# Patient Record
Sex: Female | Born: 1935 | Race: Black or African American | Hispanic: No | Smoking: Former smoker
Health system: Southern US, Community
[De-identification: ages and names within clinical notes are randomized; demographics above are authoritative.]

## PROBLEM LIST (undated history)

## (undated) DIAGNOSIS — E119 Type 2 diabetes mellitus without complications: Secondary | ICD-10-CM

## (undated) DIAGNOSIS — Z86718 Personal history of other venous thrombosis and embolism: Secondary | ICD-10-CM

## (undated) DIAGNOSIS — I10 Essential (primary) hypertension: Secondary | ICD-10-CM

## (undated) DIAGNOSIS — I639 Cerebral infarction, unspecified: Secondary | ICD-10-CM

## (undated) DIAGNOSIS — C50919 Malignant neoplasm of unspecified site of unspecified female breast: Secondary | ICD-10-CM

## (undated) DIAGNOSIS — E7800 Pure hypercholesterolemia, unspecified: Secondary | ICD-10-CM

## (undated) DIAGNOSIS — K8020 Calculus of gallbladder without cholecystitis without obstruction: Secondary | ICD-10-CM

## (undated) HISTORY — PX: CHOLECYSTECTOMY: SHX55

## (undated) HISTORY — DX: Calculus of gallbladder without cholecystitis without obstruction: K80.20

## (undated) HISTORY — DX: Personal history of other venous thrombosis and embolism: Z86.718

## (undated) HISTORY — DX: Pure hypercholesterolemia, unspecified: E78.00

## (undated) HISTORY — DX: Cerebral infarction, unspecified: I63.9

---

## 2019-05-09 ENCOUNTER — Other Ambulatory Visit: Payer: Self-pay | Admitting: Cardiology

## 2019-05-09 DIAGNOSIS — Z20822 Contact with and (suspected) exposure to covid-19: Secondary | ICD-10-CM

## 2019-05-11 ENCOUNTER — Telehealth: Payer: Self-pay

## 2019-05-11 LAB — NOVEL CORONAVIRUS, NAA: SARS-CoV-2, NAA: NOT DETECTED

## 2019-05-11 NOTE — Telephone Encounter (Signed)
Patient called in and received her negative covid result

## 2019-07-28 ENCOUNTER — Emergency Department (HOSPITAL_COMMUNITY): Payer: Medicare Other

## 2019-07-28 ENCOUNTER — Emergency Department (HOSPITAL_COMMUNITY)
Admission: EM | Admit: 2019-07-28 | Discharge: 2019-07-28 | Disposition: A | Discharge status: Home / Self Care | Payer: Medicare Other | Attending: Emergency Medicine | Admitting: Emergency Medicine

## 2019-07-28 ENCOUNTER — Encounter (HOSPITAL_COMMUNITY): Payer: Self-pay

## 2019-07-28 ENCOUNTER — Other Ambulatory Visit: Payer: Self-pay

## 2019-07-28 DIAGNOSIS — C50919 Malignant neoplasm of unspecified site of unspecified female breast: Secondary | ICD-10-CM | POA: Diagnosis not present

## 2019-07-28 DIAGNOSIS — I10 Essential (primary) hypertension: Secondary | ICD-10-CM | POA: Diagnosis not present

## 2019-07-28 DIAGNOSIS — W01198A Fall on same level from slipping, tripping and stumbling with subsequent striking against other object, initial encounter: Secondary | ICD-10-CM | POA: Insufficient documentation

## 2019-07-28 DIAGNOSIS — S0012XA Contusion of left eyelid and periocular area, initial encounter: Secondary | ICD-10-CM | POA: Diagnosis not present

## 2019-07-28 DIAGNOSIS — Y929 Unspecified place or not applicable: Secondary | ICD-10-CM | POA: Diagnosis not present

## 2019-07-28 DIAGNOSIS — Z79899 Other long term (current) drug therapy: Secondary | ICD-10-CM | POA: Diagnosis not present

## 2019-07-28 DIAGNOSIS — S0512XA Contusion of eyeball and orbital tissues, left eye, initial encounter: Secondary | ICD-10-CM

## 2019-07-28 DIAGNOSIS — Z7984 Long term (current) use of oral hypoglycemic drugs: Secondary | ICD-10-CM | POA: Insufficient documentation

## 2019-07-28 DIAGNOSIS — Y9389 Activity, other specified: Secondary | ICD-10-CM | POA: Insufficient documentation

## 2019-07-28 DIAGNOSIS — Z87891 Personal history of nicotine dependence: Secondary | ICD-10-CM | POA: Diagnosis not present

## 2019-07-28 DIAGNOSIS — E119 Type 2 diabetes mellitus without complications: Secondary | ICD-10-CM | POA: Insufficient documentation

## 2019-07-28 DIAGNOSIS — S0003XA Contusion of scalp, initial encounter: Secondary | ICD-10-CM | POA: Insufficient documentation

## 2019-07-28 DIAGNOSIS — Y999 Unspecified external cause status: Secondary | ICD-10-CM | POA: Diagnosis not present

## 2019-07-28 DIAGNOSIS — W19XXXA Unspecified fall, initial encounter: Secondary | ICD-10-CM

## 2019-07-28 DIAGNOSIS — S0990XA Unspecified injury of head, initial encounter: Secondary | ICD-10-CM | POA: Diagnosis present

## 2019-07-28 HISTORY — DX: Essential (primary) hypertension: I10

## 2019-07-28 HISTORY — DX: Malignant neoplasm of unspecified site of unspecified female breast: C50.919

## 2019-07-28 HISTORY — DX: Type 2 diabetes mellitus without complications: E11.9

## 2019-07-28 NOTE — ED Provider Notes (Signed)
[facility] DEPT Provider Note   CSN: [id number] Arrival date & time: 07/28/19  1151     History No chief complaint on file.   Terrylynn Shugarts is a 86 y.o. female with a reported past medical history of metastatic breast cancer primarily treated at [facility], anticoagulated with Xarelto who presents today for evaluation after a fall. She reports that she fell on Tuesday while bringing groceries inside.  She states that this was a mechanical slip causing her to fall striking the left side of her face on a railing.  She denies any loss of consciousness.  On Wednesday she reportedly went to her cancer center appointment where she was told to get this checked out according to patient however she did not. She is reportedly visiting family today who saw this and called 911. She reports in addition that, while she had a "goose egg" on the left side of her forehead that today when she woke up she had bruising under her left eye.  She denies any changes in vision to her left eye.  She reports that she feels at her normal baseline.  No vomiting or new pains.  She denies any concern for injuries from this fall in her chest, abdomen, pelvis, and bilateral upper and lower extremities.  No new back pains or concern for a back injury per patient.  History is limited as at this time unable to obtain results from [facility] and patient is unsure of all of her medications and exact diagnoses.  HPI     Past Medical History:  Diagnosis Date  . Breast cancer ([place])   . Diabetes mellitus without complication ([place])   . Hypertension     There are no problems to display for this patient.   Past Surgical History:  Procedure Laterality Date  . CHOLECYSTECTOMY       OB History   No obstetric history on file.     No family history on file.  Social History   Tobacco Use  . Smoking status: Former Research scientist (life sciences)  . Smokeless tobacco: Never Used  Substance Use Topics  . Alcohol use: Never    . Drug use: Never    Home Medications Prior to Admission medications   Medication Sig Start Date End Date Taking? Authorizing Provider  gabapentin (NEURONTIN) 100 MG capsule Take 100 mg by mouth at bedtime. 05/15/19  Yes [provider]  glimepiride (AMARYL) 1 MG tablet Take 1 mg by mouth every morning. 04/08/19  Yes [provider]  IBRANCE 125 MG tablet Take 125 mg by mouth daily.  07/11/19  Yes [provider]  isosorbide mononitrate (IMDUR) 60 MG 24 hr tablet Take 60 mg by mouth daily. 07/10/19  Yes [provider]  letrozole (FEMARA) 2.5 MG tablet Take 2.5 mg by mouth daily. 07/15/19  Yes [provider]  lisinopril (ZESTRIL) 20 MG tablet Take 20 mg by mouth daily. 07/10/19  Yes [provider]  metFORMIN (GLUCOPHAGE) 1000 MG tablet Take 1,000 mg by mouth 2 (two) times daily. 06/15/19  Yes [provider]  Multiple Vitamin (MULTIVITAMIN) tablet Take 1 tablet by mouth daily.   Yes [provider]  Omega-3 Fatty Acids (FISH OIL) 1000 MG CAPS Take 1,000 mg by mouth daily.   Yes [provider]  traZODone (DESYREL) 100 MG tablet Take 100 mg by mouth at bedtime. 06/30/19  Yes [provider]  XARELTO 15 MG TABS tablet Take 15 mg by mouth every morning. 05/15/19  Yes  [provider]  ACCU-CHEK GUIDE test strip 1 each by Other route daily. 07/10/19   [provider]    Allergies    Patient has no known allergies.  Review of Systems   Review of Systems  Constitutional: Negative for chills and fever.  HENT: Positive for facial swelling. Negative for sore throat, trouble swallowing and voice change.        Contusion on left forehead, edema and ecchymosis under left eye.  Eyes: Negative for visual disturbance.  Respiratory: Negative for cough and shortness of breath.   Cardiovascular: Negative for chest pain and leg swelling.  Gastrointestinal: Negative for nausea and vomiting.  Musculoskeletal:  Negative for back pain and neck pain.  Neurological: Negative for weakness, numbness and headaches.  Psychiatric/Behavioral: Negative for confusion.  All other systems reviewed and are negative.   Physical Exam Updated Vital Signs BP (!) 166/67   Pulse 65   Temp 97.7 F (36.5 C) (Oral)   Resp 16   SpO2 98%   Physical Exam Vitals and nursing note reviewed.  Constitutional:      General: She is not in acute distress.    Appearance: She is well-developed. She is not ill-appearing.  HENT:     Head: Normocephalic.     Comments: There is a 2 cm contusion on the left anterior forehead.  There is a moderate amount of edema inferior to the left eye along the lower lid with associated ecchymosis. No battle signs bilaterally.  No ecchymosis under the right eye.  Tenderness to palpation over the contusion on the forehead.  No palpable crepitus or deformities. Eyes:     Extraocular Movements: Extraocular movements intact.     Conjunctiva/sclera: Conjunctivae normal.     Pupils: Pupils are equal, round, and reactive to light.     Comments: Full, pain-free EOM bilaterally.  Neck:     Comments: No midline C-spine tenderness to palpation, step-offs, or deformities. Cardiovascular:     Rate and Rhythm: Normal rate and regular rhythm.     Pulses: Normal pulses.     Heart sounds: Normal heart sounds. No murmur.     Comments: 2+ DP, radial pulses bilaterally. Pulmonary:     Effort: Pulmonary effort is normal. No respiratory distress.     Breath sounds: Normal breath sounds.  Abdominal:     General: Abdomen is flat.     Palpations: Abdomen is soft.     Tenderness: There is no abdominal tenderness.  Musculoskeletal:     Cervical back: Normal range of motion and neck supple.     Comments: 5/5 strength bilateral arms and legs.  Skin:    General: Skin is warm and dry.  Neurological:     General: No focal deficit present.     Mental Status: She is alert.     Motor: No weakness.     Comments:  Patient is able to walk to the bathroom without difficulties or significant abnormal gait. She is awake and alert, answers all questions appropriately.  Psychiatric:        Mood and Affect: Mood normal.        Behavior: Behavior normal.     ED Results / Procedures / Treatments   Labs (all labs ordered are listed, but only abnormal results are displayed) Labs Reviewed - No data to display  EKG None  Radiology CT Head Wo Contrast  Result Date: 07/28/2019 CLINICAL DATA:  Fall 3 days ago.  Head injury. EXAM: CT HEAD WITHOUT CONTRAST  CT MAXILLOFACIAL WITHOUT CONTRAST CT CERVICAL SPINE WITHOUT CONTRAST TECHNIQUE: Multidetector CT imaging of the head, cervical spine, and maxillofacial structures were performed using the standard protocol without intravenous contrast. Multiplanar CT image reconstructions of the cervical spine and maxillofacial structures were also generated. COMPARISON:  None. FINDINGS: CT HEAD FINDINGS Brain: Generalized atrophy without hydrocephalus. Patchy white matter hypodensity bilaterally compatible with chronic microvascular ischemia. Chronic infarct right cerebellum. Negative for acute infarct, intracranial hemorrhage, or mass. No midline shift. Vascular: Negative for hyperdense vessel Skull: Negative Other: None CT MAXILLOFACIAL FINDINGS Osseous: Negative for fracture.  No skeletal lesion identified. Orbits: Bilateral cataract extraction.  No orbital mass or edema. Sinuses: Moderate mucosal edema right maxillary sinus. Mild mucosal edema in the left maxillary sinus and ethmoid sinuses. No air-fluid levels identified. Soft tissues: Soft tissue swelling over the left maxilla and lateral orbit. CT CERVICAL SPINE FINDINGS Alignment: 3 mm anterolisthesis C7-T1. 2 mm anterolisthesis T1-2 and T2-3. Cervical alignment normal Skull base and vertebrae: Negative for cervical spine fracture. Soft tissues and spinal canal: Atherosclerotic calcification. No soft tissue mass Disc levels:  Multilevel disc degeneration and spurring throughout the cervical spine. Anterolisthesis in the upper thoracic spine appears degenerative with facet degeneration of these levels. Upper chest: Clear Other: None IMPRESSION: 1. Atrophy and chronic microvascular ischemic change in the white matter. No acute intracranial abnormality 2. Negative for facial fracture 3. Negative for cervical spine fracture. Electronically Signed   By: Franchot Gallo M.D.   On: 07/28/2019 13:12   CT Cervical Spine Wo Contrast  Result Date: 07/28/2019 CLINICAL DATA:  Fall 3 days ago.  Head injury. EXAM: CT HEAD WITHOUT CONTRAST CT MAXILLOFACIAL WITHOUT CONTRAST CT CERVICAL SPINE WITHOUT CONTRAST TECHNIQUE: Multidetector CT imaging of the head, cervical spine, and maxillofacial structures were performed using the standard protocol without intravenous contrast. Multiplanar CT image reconstructions of the cervical spine and maxillofacial structures were also generated. COMPARISON:  None. FINDINGS: CT HEAD FINDINGS Brain: Generalized atrophy without hydrocephalus. Patchy white matter hypodensity bilaterally compatible with chronic microvascular ischemia. Chronic infarct right cerebellum. Negative for acute infarct, intracranial hemorrhage, or mass. No midline shift. Vascular: Negative for hyperdense vessel Skull: Negative Other: None CT MAXILLOFACIAL FINDINGS Osseous: Negative for fracture.  No skeletal lesion identified. Orbits: Bilateral cataract extraction.  No orbital mass or edema. Sinuses: Moderate mucosal edema right maxillary sinus. Mild mucosal edema in the left maxillary sinus and ethmoid sinuses. No air-fluid levels identified. Soft tissues: Soft tissue swelling over the left maxilla and lateral orbit. CT CERVICAL SPINE FINDINGS Alignment: 3 mm anterolisthesis C7-T1. 2 mm anterolisthesis T1-2 and T2-3. Cervical alignment normal Skull base and vertebrae: Negative for cervical spine fracture. Soft tissues and spinal canal:  Atherosclerotic calcification. No soft tissue mass Disc levels: Multilevel disc degeneration and spurring throughout the cervical spine. Anterolisthesis in the upper thoracic spine appears degenerative with facet degeneration of these levels. Upper chest: Clear Other: None IMPRESSION: 1. Atrophy and chronic microvascular ischemic change in the white matter. No acute intracranial abnormality 2. Negative for facial fracture 3. Negative for cervical spine fracture. Electronically Signed   By: Franchot Gallo M.D.   On: 07/28/2019 13:12   CT Maxillofacial WO CM  Result Date: 07/28/2019 CLINICAL DATA:  Fall 3 days ago.  Head injury. EXAM: CT HEAD WITHOUT CONTRAST CT MAXILLOFACIAL WITHOUT CONTRAST CT CERVICAL SPINE WITHOUT CONTRAST TECHNIQUE: Multidetector CT imaging of the head, cervical spine, and maxillofacial structures were performed using the standard protocol without intravenous contrast. Multiplanar CT image reconstructions of the cervical  spine and maxillofacial structures were also generated. COMPARISON:  None. FINDINGS: CT HEAD FINDINGS Brain: Generalized atrophy without hydrocephalus. Patchy white matter hypodensity bilaterally compatible with chronic microvascular ischemia. Chronic infarct right cerebellum. Negative for acute infarct, intracranial hemorrhage, or mass. No midline shift. Vascular: Negative for hyperdense vessel Skull: Negative Other: None CT MAXILLOFACIAL FINDINGS Osseous: Negative for fracture.  No skeletal lesion identified. Orbits: Bilateral cataract extraction.  No orbital mass or edema. Sinuses: Moderate mucosal edema right maxillary sinus. Mild mucosal edema in the left maxillary sinus and ethmoid sinuses. No air-fluid levels identified. Soft tissues: Soft tissue swelling over the left maxilla and lateral orbit. CT CERVICAL SPINE FINDINGS Alignment: 3 mm anterolisthesis C7-T1. 2 mm anterolisthesis T1-2 and T2-3. Cervical alignment normal Skull base and vertebrae: Negative for cervical  spine fracture. Soft tissues and spinal canal: Atherosclerotic calcification. No soft tissue mass Disc levels: Multilevel disc degeneration and spurring throughout the cervical spine. Anterolisthesis in the upper thoracic spine appears degenerative with facet degeneration of these levels. Upper chest: Clear Other: None IMPRESSION: 1. Atrophy and chronic microvascular ischemic change in the white matter. No acute intracranial abnormality 2. Negative for facial fracture 3. Negative for cervical spine fracture. Electronically Signed   By: Franchot Gallo M.D.   On: 07/28/2019 13:12    Procedures Procedures (including critical care time)  Medications Ordered in ED Medications - No data to display  ED Course  I have reviewed the triage vital signs and the nursing notes.  Pertinent labs & imaging results that were available during my care of the patient were reviewed by me and considered in my medical decision making (see chart for details).    MDM Rules/Calculators/A&P                     Patient presents today for evaluation after a fall that occurred 3 days ago.  She is reportedly anticoagulated with Xarelto. On exam she has a contusion on the left-sided forehead and ecchymosis under the left eye. She is awake and alert answers questions appropriately and oriented. As she is anticoagulated, along with her age CT scan head, face, and neck were obtained.  CT scans showed no evidence of acute injury.    This patient was seen as a shared visit with Dr. Wilson Singer.   Patient given information on fall prevention at home, along with educated that if she hits her head while on blood thinners she needs to present to the emergency room immediately.   Return precautions were discussed with patient who states their understanding.  At the time of discharge patient denied any unaddressed complaints or concerns.  Patient is agreeable for discharge home.  Note: Portions of this report may have been transcribed  using voice recognition software. Every effort was made to ensure accuracy; however, inadvertent computerized transcription errors may be present   Final Clinical Impression(s) / ED Diagnoses Final diagnoses:  Fall, initial encounter  Contusion of scalp, initial encounter  Ecchymosis of left eye, initial encounter    Rx / DC Orders ED Discharge Orders    None       Ollen Gross 07/28/19 2155    Virgel Manifold, MD 08/01/19 [phone]

## 2019-07-28 NOTE — Discharge Instructions (Signed)
If you hit your head while taking blood thinners you need to go to the emergency room immediately, rather than waiting multiple days.  Failure to do this can result in a life or function threatening brain bleed.

## 2019-07-28 NOTE — ED Triage Notes (Signed)
86 yo female brought in by [facility] from home s/p trip and fall on Tuesday. Pt was outdoors bringing groceries in and tripped while going up the stairs.  Hit head on banister. Pt is on Xarelto. Denies LOC. Denies neck or back pain. Able to get herself up. Hematoma on left forehead. Pt was not seen at that time. On Wednesday pt went to [facility] and MD advised her to be seen in ED. Pt is visiting family today who called EMS.

## 2019-07-28 NOTE — ED Notes (Signed)
Elizabeth, PA at bedside.  

## 2019-07-28 NOTE — ED Notes (Signed)
purewick placed on patient.  

## 2019-07-28 NOTE — ED Notes (Signed)
Pt's family member is Heinz Knuckles [phone].

## 2019-07-28 NOTE — ED Triage Notes (Signed)
Pt ambulated to BR from EMS stretcher independently. Gait slow but steady. Returned to room stretcher without distress.

## 2019-07-28 NOTE — ED Notes (Signed)
Patient reports she wet her pants and underwear and would like for family to bring her a change of clothes when discharged.

## 2020-07-09 ENCOUNTER — Emergency Department (HOSPITAL_COMMUNITY)
Admission: EM | Admit: 2020-07-09 | Discharge: 2020-07-09 | Disposition: A | Discharge status: Home / Self Care | Payer: Medicare Other | Attending: Emergency Medicine | Admitting: Emergency Medicine

## 2020-07-09 ENCOUNTER — Emergency Department (HOSPITAL_COMMUNITY): Payer: Medicare Other

## 2020-07-09 DIAGNOSIS — Z853 Personal history of malignant neoplasm of breast: Secondary | ICD-10-CM | POA: Diagnosis not present

## 2020-07-09 DIAGNOSIS — R0602 Shortness of breath: Secondary | ICD-10-CM

## 2020-07-09 DIAGNOSIS — Z87891 Personal history of nicotine dependence: Secondary | ICD-10-CM | POA: Insufficient documentation

## 2020-07-09 DIAGNOSIS — I10 Essential (primary) hypertension: Secondary | ICD-10-CM | POA: Insufficient documentation

## 2020-07-09 DIAGNOSIS — Z79899 Other long term (current) drug therapy: Secondary | ICD-10-CM | POA: Insufficient documentation

## 2020-07-09 DIAGNOSIS — Z7901 Long term (current) use of anticoagulants: Secondary | ICD-10-CM | POA: Insufficient documentation

## 2020-07-09 DIAGNOSIS — Z20822 Contact with and (suspected) exposure to covid-19: Secondary | ICD-10-CM | POA: Insufficient documentation

## 2020-07-09 DIAGNOSIS — E119 Type 2 diabetes mellitus without complications: Secondary | ICD-10-CM | POA: Insufficient documentation

## 2020-07-09 DIAGNOSIS — Z7984 Long term (current) use of oral hypoglycemic drugs: Secondary | ICD-10-CM | POA: Insufficient documentation

## 2020-07-09 LAB — COMPREHENSIVE METABOLIC PANEL
ALT: 15 U/L (ref 0–44)
AST: 24 U/L (ref 15–41)
Albumin: 4.2 g/dL (ref 3.5–5.0)
Alkaline Phosphatase: 39 U/L (ref 38–126)
Anion gap: 11 (ref 5–15)
BUN: 24 mg/dL — ABNORMAL HIGH (ref 8–23)
CO2: 23 mmol/L (ref 22–32)
Calcium: 10.1 mg/dL (ref 8.9–10.3)
Chloride: 104 mmol/L (ref 98–111)
Creatinine, Ser: 1.44 mg/dL — ABNORMAL HIGH (ref 0.44–1.00)
GFR, Estimated: 35 mL/min — ABNORMAL LOW (ref >60)
Glucose, Bld: 186 mg/dL — ABNORMAL HIGH (ref 70–99)
Potassium: 4.4 mmol/L (ref 3.5–5.1)
Sodium: 138 mmol/L (ref 135–145)
Total Bilirubin: 0.5 mg/dL (ref 0.3–1.2)
Total Protein: 7.5 g/dL (ref 6.5–8.1)

## 2020-07-09 LAB — CBC WITH DIFFERENTIAL/PLATELET
Abs Immature Granulocytes: 0.01 10*3/uL (ref 0.00–0.07)
Basophils Absolute: 0 10*3/uL (ref 0.0–0.1)
Basophils Relative: 1 %
Eosinophils Absolute: 0 10*3/uL (ref 0.0–0.5)
Eosinophils Relative: 1 %
HCT: 29.2 % — ABNORMAL LOW (ref 36.0–46.0)
Hemoglobin: 10.1 g/dL — ABNORMAL LOW (ref 12.0–15.0)
Immature Granulocytes: 0 %
Lymphocytes Relative: 34 %
Lymphs Abs: 1.2 10*3/uL (ref 0.7–4.0)
MCH: 37.1 pg — ABNORMAL HIGH (ref 26.0–34.0)
MCHC: 34.6 g/dL (ref 30.0–36.0)
MCV: 107.4 fL — ABNORMAL HIGH (ref 80.0–100.0)
Monocytes Absolute: 0.4 10*3/uL (ref 0.1–1.0)
Monocytes Relative: 11 %
Neutro Abs: 1.9 10*3/uL (ref 1.7–7.7)
Neutrophils Relative %: 53 %
Platelets: 201 10*3/uL (ref 150–400)
RBC: 2.72 MIL/uL — ABNORMAL LOW (ref 3.87–5.11)
RDW: 13.5 % (ref 11.5–15.5)
WBC: 3.6 10*3/uL — ABNORMAL LOW (ref 4.0–10.5)
nRBC: 0 % (ref 0.0–0.2)

## 2020-07-09 LAB — SARS CORONAVIRUS 2 BY RT PCR (HOSPITAL ORDER, PERFORMED IN ~~LOC~~ HOSPITAL LAB): SARS Coronavirus 2: NEGATIVE

## 2020-07-09 LAB — TROPONIN I (HIGH SENSITIVITY): Troponin I (High Sensitivity): 15 ng/L (ref <18)

## 2020-07-09 LAB — BRAIN NATRIURETIC PEPTIDE: B Natriuretic Peptide: 108.6 pg/mL — ABNORMAL HIGH (ref 0.0–100.0)

## 2020-07-09 MED ORDER — ALBUTEROL SULFATE HFA 108 (90 BASE) MCG/ACT IN AERS: 2.0000 | INHALATION_SPRAY | RESPIRATORY_TRACT | 0 refills | Status: DC | PRN

## 2020-07-09 NOTE — Discharge Instructions (Addendum)
Your work-up today was negative.  Covid test was negative. I have sent inhaler to your pharmacy.  You can use when needed if feeling overly short of breath. Follow-up with your primary care doctor. Return here for new concerns.

## 2020-07-09 NOTE — ED Provider Notes (Signed)
[facility] DEPT Provider Note   CSN: [id number] Arrival date & time: 07/09/20  2119     History Chief Complaint  Patient presents with  . Shortness of Breath    Kristina Jackson is a 87 y.o. female.  The history is provided by medical records and the patient.  Shortness of Breath   87 y.o. F with hx of breast cancer, DM, HTN, presenting to the ED for SOB. Patient states this has been an ongoing issue for the past 4 to 5 years. States over the past 2 months it seems like it is getting worse. She states her symptoms actually improve when she is up and moving around, worse when she is just lying in bed or trying to rest. She has not had any cough or fever. Reports recent Covid test yesterday that was negative. She denies any sick contacts.  Past Medical History:  Diagnosis Date  . Breast cancer ([place])   . Diabetes mellitus without complication ([place])   . Hypertension     There are no problems to display for this patient.   Past Surgical History:  Procedure Laterality Date  . CHOLECYSTECTOMY       OB History   No obstetric history on file.     No family history on file.  Social History   Tobacco Use  . Smoking status: Former Research scientist (life sciences)  . Smokeless tobacco: Never Used  Vaping Use  . Vaping Use: Never used  Substance Use Topics  . Alcohol use: Never  . Drug use: Never    Home Medications Prior to Admission medications   Medication Sig Start Date End Date Taking? Authorizing Provider  ACCU-CHEK GUIDE test strip 1 each by Other route daily. 07/10/19   [provider]  gabapentin (NEURONTIN) 100 MG capsule Take 100 mg by mouth at bedtime. 05/15/19   [provider]  glimepiride (AMARYL) 1 MG tablet Take 1 mg by mouth every morning. 04/08/19   [provider]  IBRANCE 125 MG tablet Take 125 mg by mouth daily.  07/11/19   [provider]  isosorbide mononitrate (IMDUR) 60 MG 24 hr tablet Take 60 mg by mouth  daily. 07/10/19   [provider]  letrozole (FEMARA) 2.5 MG tablet Take 2.5 mg by mouth daily. 07/15/19   [provider]  lisinopril (ZESTRIL) 20 MG tablet Take 20 mg by mouth daily. 07/10/19   [provider]  metFORMIN (GLUCOPHAGE) 1000 MG tablet Take 1,000 mg by mouth 2 (two) times daily. 06/15/19   [provider]  Multiple Vitamin (MULTIVITAMIN) tablet Take 1 tablet by mouth daily.    [provider]  Omega-3 Fatty Acids (FISH OIL) 1000 MG CAPS Take 1,000 mg by mouth daily.    [provider]  traZODone (DESYREL) 100 MG tablet Take 100 mg by mouth at bedtime. 06/30/19   [provider]  XARELTO 15 MG TABS tablet Take 15 mg by mouth every morning. 05/15/19   [provider]    Allergies    Patient has no known allergies.  Review of Systems   Review of Systems  Respiratory: Positive for shortness of breath.   All other systems reviewed and are negative.   Physical Exam Updated Vital Signs BP (!) 179/73   Pulse 69   Temp 98.3 F (36.8 C) (Oral)   Resp 17   Ht 5\' 2"  (1.575 m)   Wt 54.4 kg   SpO2 100%   BMI 21.95 kg/m  Physical Exam Vitals and nursing note reviewed.  Constitutional:      Appearance: She is well-developed and well-nourished.  HENT:     Head: Normocephalic and atraumatic.     Mouth/Throat:     Mouth: Oropharynx is clear and moist.  Eyes:     Extraocular Movements: EOM normal.     Conjunctiva/sclera: Conjunctivae normal.     Pupils: Pupils are equal, round, and reactive to light.  Cardiovascular:     Rate and Rhythm: Normal rate and regular rhythm.     Heart sounds: Normal heart sounds.  Pulmonary:     Effort: Pulmonary effort is normal.     Breath sounds: Normal breath sounds. No wheezing or rhonchi.     Comments: NAD, breathing easily, lungs CTAB Abdominal:     General: Bowel sounds are normal.     Palpations: Abdomen is soft.  Musculoskeletal:        General: Normal range of  motion.     Cervical back: Normal range of motion.  Skin:    General: Skin is warm and dry.  Neurological:     Mental Status: She is alert and oriented to person, place, and time.  Psychiatric:        Mood and Affect: Mood and affect normal.     Comments: Seems a bit anxious     ED Results / Procedures / Treatments   Labs (all labs ordered are listed, but only abnormal results are displayed) Labs Reviewed  CBC WITH DIFFERENTIAL/PLATELET - Abnormal; Notable for the following components:      Result Value   WBC 3.6 (*)    RBC 2.72 (*)    Hemoglobin 10.1 (*)    HCT 29.2 (*)    MCV 107.4 (*)    MCH 37.1 (*)    All other components within normal limits  COMPREHENSIVE METABOLIC PANEL - Abnormal; Notable for the following components:   Glucose, Bld 186 (*)    BUN 24 (*)    Creatinine, Ser 1.44 (*)    GFR, Estimated 35 (*)    All other components within normal limits  BRAIN NATRIURETIC PEPTIDE - Abnormal; Notable for the following components:   B Natriuretic Peptide 108.6 (*)    All other components within normal limits  SARS CORONAVIRUS 2 BY RT PCR (HOSPITAL ORDER, [place] [facility])  TROPONIN I (HIGH SENSITIVITY)  TROPONIN I (HIGH SENSITIVITY)    EKG EKG Interpretation  Date/Time:  Tuesday July 09 2020 21:46:22 EST Ventricular Rate:  71 PR Interval:    QRS Duration: 154 QT Interval:  441 QTC Calculation: 480 R Axis:   68 Text Interpretation: Sinus rhythm Right bundle branch block No previous ECGs available Confirmed by Wandra Arthurs ([id number]) on 07/09/2020 10:53:11 PM   Radiology DG Chest Port 1 View  Result Date: 07/09/2020 CLINICAL DATA:  Shortness of breath. EXAM: PORTABLE CHEST 1 VIEW COMPARISON:  None. FINDINGS: Lung volumes are low. Mild cardiomegaly. Normal mediastinal contours for degree of inspiration and technique. Mild peribronchial thickening. No focal airspace disease. No pleural fluid or pneumothorax. Degenerative change in the spine.  IMPRESSION: 1. Mild peribronchial thickening suggesting bronchitis. 2. Mild cardiomegaly. Electronically Signed   By: Keith Rake M.D.   On: 07/09/2020 22:22    Procedures Procedures   Medications Ordered in ED Medications - No data to display  ED Course  I have reviewed the triage vital signs and the nursing notes.  Pertinent labs & imaging results that were  available during my care of the patient were reviewed by me and considered in my medical decision making (see chart for details).    MDM Rules/Calculators/A&P  87 year old female presenting to the ED with shortness of breath.  Reports ongoing for for 5 years, worse over the past few months.  Denies recent illness, cough, or fever.  States she had a Covid test yesterday that was negative.  She is afebrile and nontoxic in appearance here.  She does appear mildly anxious but is in NAD distress.  Lungs clear bilaterally.  EKG with RBBB. Labs grossly reassuring, troponin negative. BNP is mildly elevated but patient does not appear fluid overloaded. Chest x-ray with findings of peribronchial thickening, however she is without cough, fever, or other infectious symptoms. Given her reported course over years 2 months, doubt acute infection. She does appear somewhat anxious, I suspect this may be playing a role in her symptoms. For she is stable for discharge home.  She was given prescription for inhaler to use as needed. Close follow-up with PCP. Return here for any new or acute changes.  Shared visit with attending physician, Dr. Darl Householder, who agrees with treatment plan.  Final Clinical Impression(s) / ED Diagnoses Final diagnoses:  Shortness of breath    Rx / DC Orders ED Discharge Orders         Ordered    albuterol (VENTOLIN HFA) 108 (90 Base) MCG/ACT inhaler  Every 4 hours PRN        07/10/20 0008    albuterol (VENTOLIN HFA) 108 (90 Base) MCG/ACT inhaler  Every 4 hours PRN,   Status:  Discontinued        07/09/20 2330            Larene Pickett, PA-C 07/10/20 0342    Drenda Freeze, MD 07/10/20 [phone]

## 2020-07-09 NOTE — ED Triage Notes (Signed)
Patient BIB EMS for SOB. Patient denies history of COPD, CHF, asthma. Had COVID two weeks ago, negative test since then. Patient does not use an inhaler at home. Patient endorses history of anxiety. Patient is 100% on room air. Patient asked to stay in bed and not pace in room due to being a fall risk. Patient is AxOx4.

## 2020-07-10 MED ORDER — ALBUTEROL SULFATE HFA 108 (90 BASE) MCG/ACT IN AERS: 2.0000 | INHALATION_SPRAY | RESPIRATORY_TRACT | 0 refills | Status: DC | PRN

## 2021-05-28 IMAGING — CT CT MAXILLOFACIAL W/O CM
3 of 4 series · 14 of 47 positions shown, 17 images · non-contrast
Comparison: None.

CLINICAL DATA: Fall 3 days ago.  Head injury.

EXAM:
CT HEAD WITHOUT CONTRAST
CT MAXILLOFACIAL WITHOUT CONTRAST
CT CERVICAL SPINE WITHOUT CONTRAST
TECHNIQUE: Multidetector CT imaging of the head, cervical spine, and
maxillofacial structures were performed using the standard protocol
without intravenous contrast. Multiplanar CT image reconstructions
of the cervical spine and maxillofacial structures were also
generated.

[Series 3: max soft · axial · 0.32mm/px · z∈[-270,-116]mm · 9 of 91 slices shown, 12 images]
[im 7/91  brain]
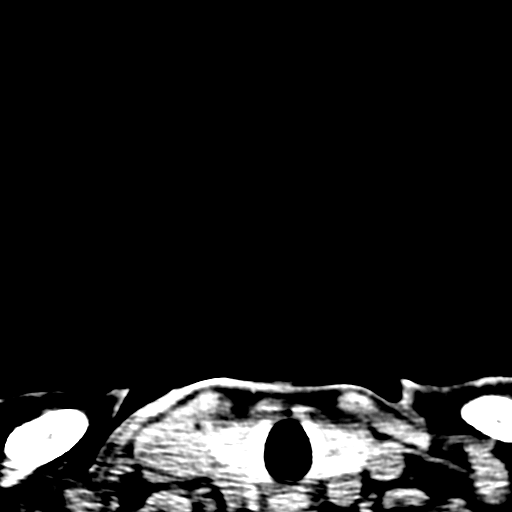
[im 7/91  bone]
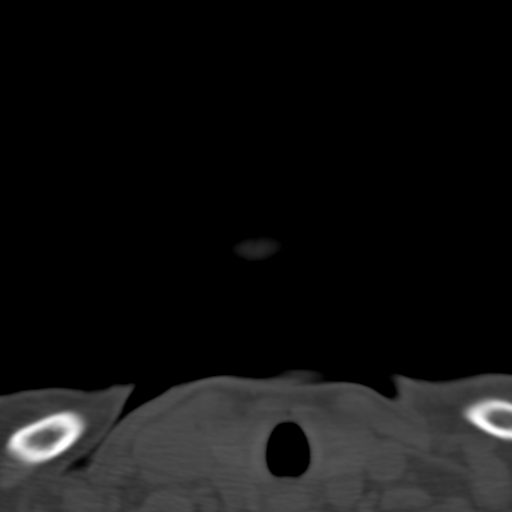
[im 16/91  bone]
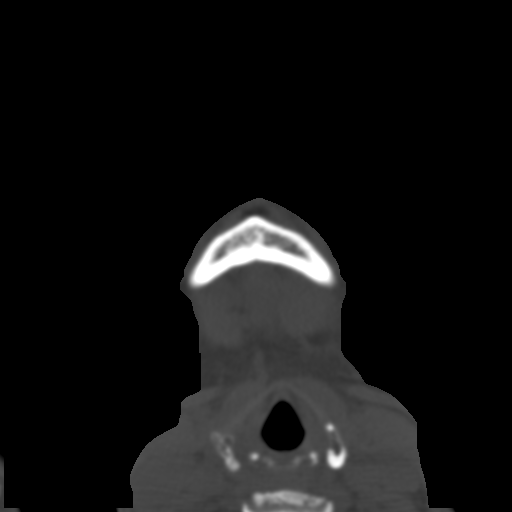
[im 25/91  bone]
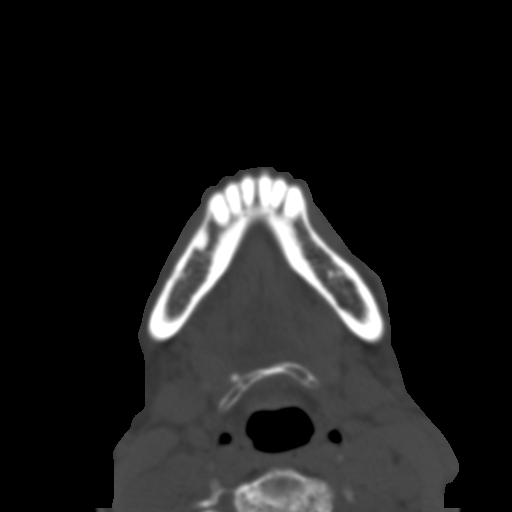
[im 35/91  bone]
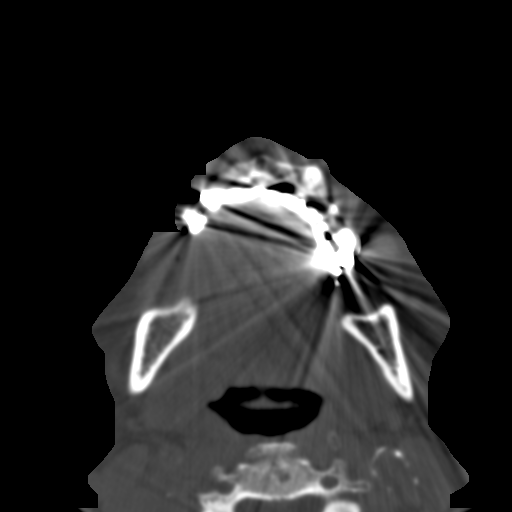
[im 47/91  brain]
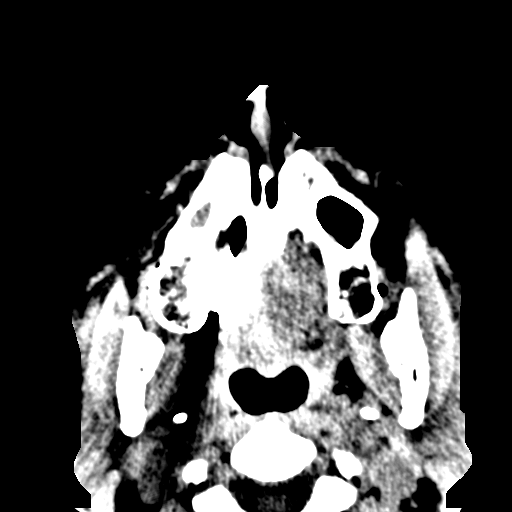
[im 47/91  bone]
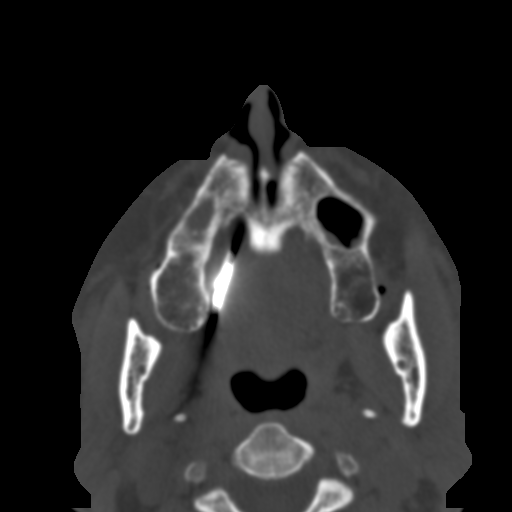
[im 56/91  bone]
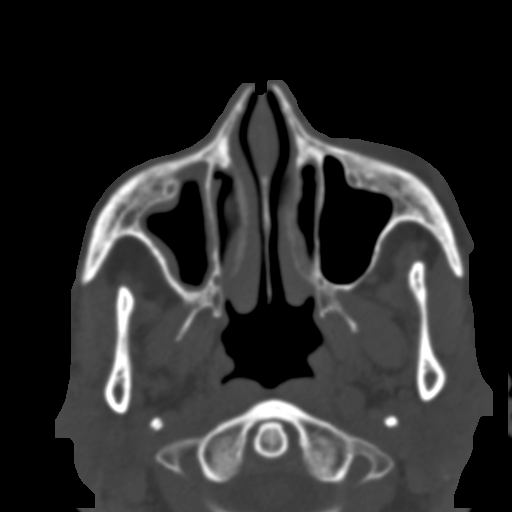
[im 66/91  bone]
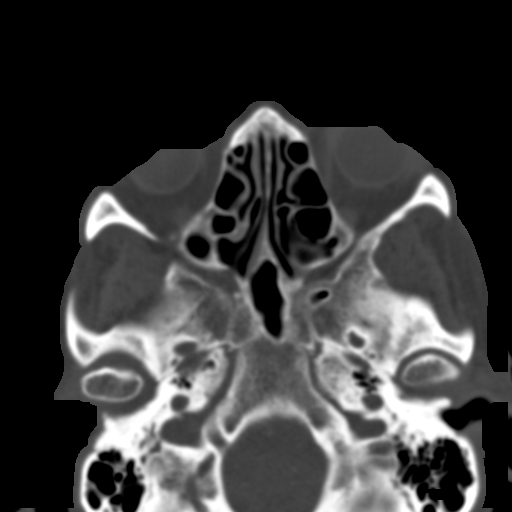
[im 75/91  bone]
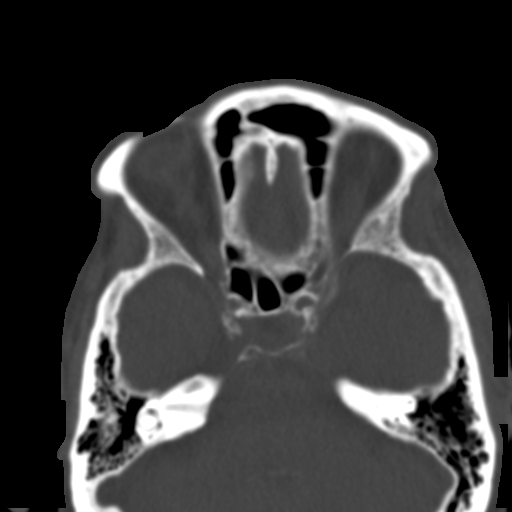
[im 84/91  brain]
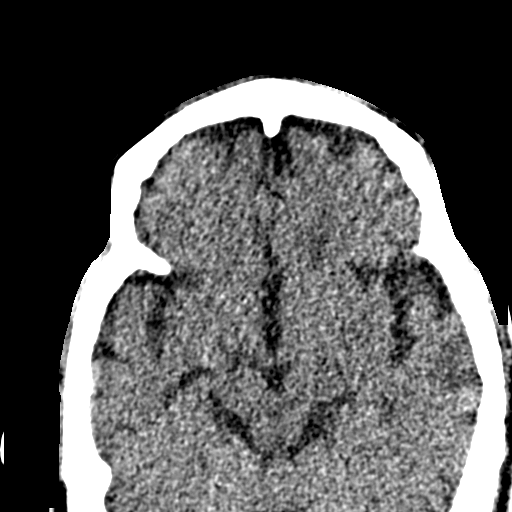
[im 84/91  bone]
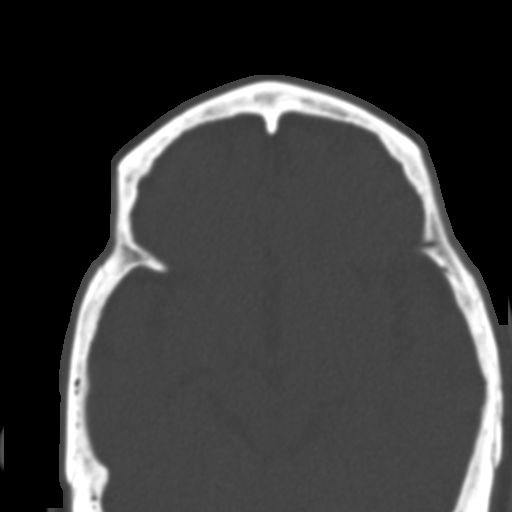

[Series 7: coronal soft · coronal · 0.36mm/px · 3 of 70 slices shown]
[im 24/70  bone]
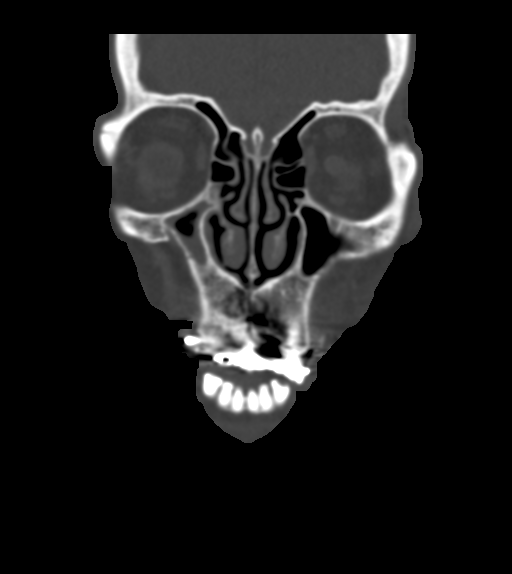
[im 31/70  bone]
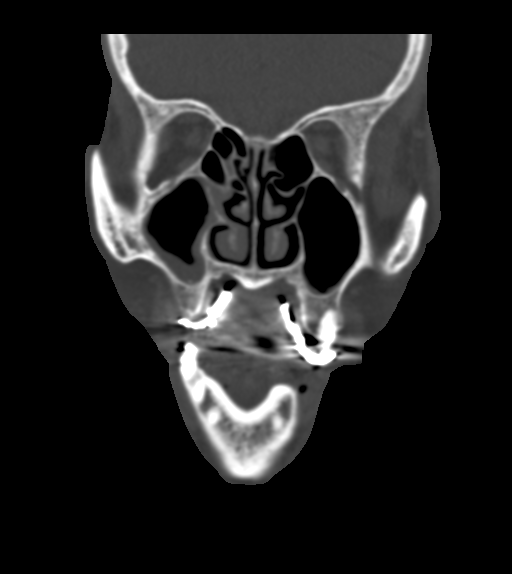
[im 39/70  bone]
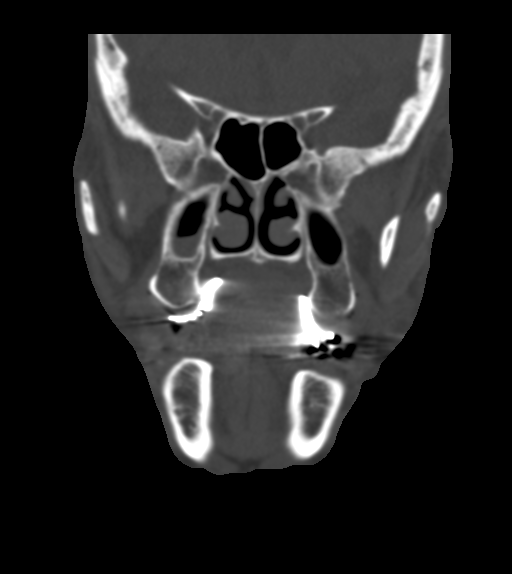

[Series 10: sagittal bone · sagittal · 0.26mm/px · 2 of 74 slices shown]
[im 25/74  bone]
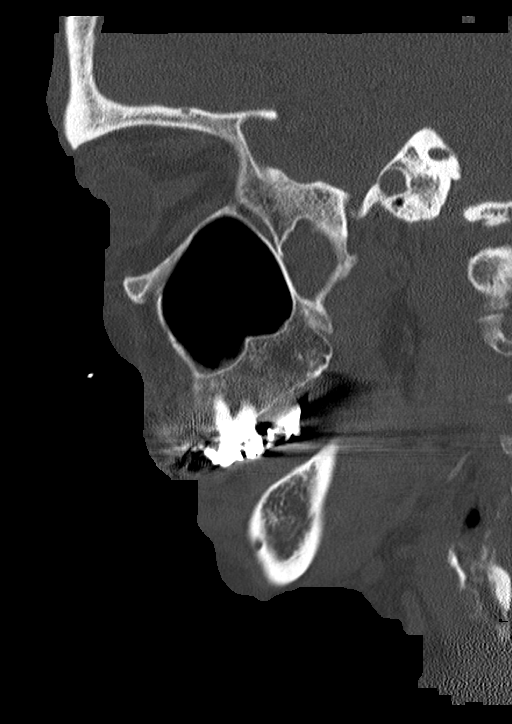
[im 49/74  bone]
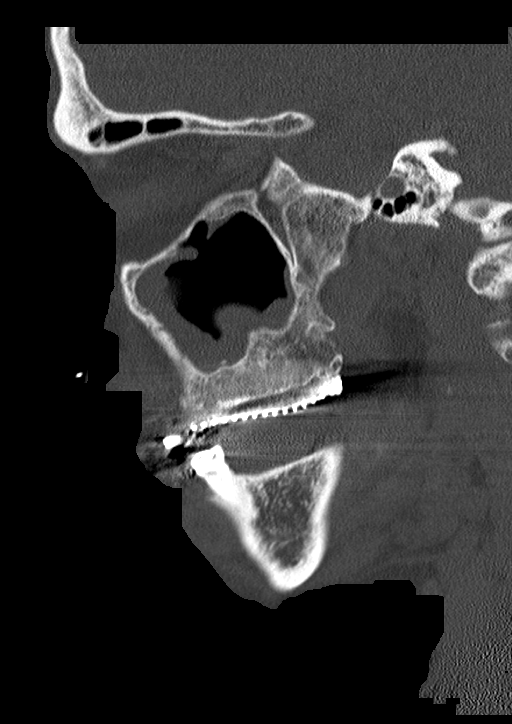

[14 of 47 positions shown; findings below may reference images not displayed]

FINDINGS: CT HEAD FINDINGS

Brain: Generalized atrophy without hydrocephalus. Patchy white
matter hypodensity bilaterally compatible with chronic microvascular
ischemia. Chronic infarct right cerebellum.

Negative for acute infarct, intracranial hemorrhage, or mass. No
midline shift.

Vascular: Negative for hyperdense vessel

Skull: Negative

Other: None

CT MAXILLOFACIAL FINDINGS

Osseous: Negative for fracture.  No skeletal lesion identified.

Orbits: Bilateral cataract extraction.  No orbital mass or edema.

Sinuses: Moderate mucosal edema right maxillary sinus. Mild mucosal
edema in the left maxillary sinus and ethmoid sinuses. No air-fluid
levels identified.

Soft tissues: Soft tissue swelling over the left maxilla and lateral
orbit.

CT CERVICAL SPINE FINDINGS

Alignment: 3 mm anterolisthesis C7-T1. 2 mm anterolisthesis T1-2 and
T2-3. Cervical alignment normal

Skull base and vertebrae: Negative for cervical spine fracture.

Soft tissues and spinal canal: Atherosclerotic calcification. No
soft tissue mass

Disc levels: Multilevel disc degeneration and spurring throughout
the cervical spine. Anterolisthesis in the upper thoracic spine
appears degenerative with facet degeneration of these levels.

Upper chest: Clear

Other: None
IMPRESSION: 1. Atrophy and chronic microvascular ischemic change in the white
matter. No acute intracranial abnormality
2. Negative for facial fracture
3. Negative for cervical spine fracture.

## 2021-06-08 HISTORY — PX: CT SCAN: SHX5351

## 2021-06-08 HISTORY — PX: DIAGNOSTIC MAMMOGRAM: HXRAD719

## 2021-10-24 ENCOUNTER — Encounter: Payer: Self-pay | Admitting: Family

## 2021-10-24 ENCOUNTER — Ambulatory Visit (INDEPENDENT_AMBULATORY_CARE_PROVIDER_SITE_OTHER): Payer: Medicare Other | Admitting: Family

## 2021-10-24 VITALS — BP 100/60 | HR 81 | Temp 97.0°F | Ht 62.0 in | Wt 123.6 lb

## 2021-10-24 DIAGNOSIS — Z7689 Persons encountering health services in other specified circumstances: Secondary | ICD-10-CM

## 2021-10-24 DIAGNOSIS — E119 Type 2 diabetes mellitus without complications: Secondary | ICD-10-CM

## 2021-10-24 DIAGNOSIS — I10 Essential (primary) hypertension: Secondary | ICD-10-CM | POA: Diagnosis not present

## 2021-10-24 DIAGNOSIS — R1310 Dysphagia, unspecified: Secondary | ICD-10-CM

## 2021-10-24 DIAGNOSIS — Z86718 Personal history of other venous thrombosis and embolism: Secondary | ICD-10-CM

## 2021-10-24 NOTE — Progress Notes (Addendum)
Provider: Marlowe Sax FNP-C   Brigham Cobbins, Nelda Bucks, NP  Patient Care Team: Shontae Rosiles, Nelda Bucks, NP as PCP - General (Family Medicine)  Extended Emergency Contact Information Primary Emergency Contact: Fisher Mobile Phone: [phone] Relation: Sister Secondary Emergency Contact: Mammie Lorenzo Mobile Phone: [phone] Relation: Other  Code Status:  Full Code  Goals of care: Advanced Directive information    10/24/2021   10:14 AM  Advanced Directives  Does Patient Have a Medical Advance Directive? No     Chief Complaint  Patient presents with   Establish Care    New patient to establish care.    HPI:  Pt is a 88 y.o. female seen today establish care here at [facility] for medical management of chronic diseases.  She is here with her sister Jaquita Rector.  Has a medical history of essential hypertension, hyperlipidemia, type 2 diabetes, right breast cancer status post chemotherapy, chemotherapy-induced peripheral neuropathy among other conditions. State has a small open wound on the right breast without any drainage.Continues to follow-up with the oncologist. States her appetite has improved on Remeron.  Also helps with sleep.  Type 2 diabetes mellitus  - blood sugars are under control states fasting usually runs in the 90s but after meals sometimes readings are in the 200s depending on what she eats. Has upcoming appointment with ophthalmology December 09, 2021 Has peripheral neuropathy from chemotherapy.  Has some dark pigmentation on the heel on the hands from chemotherapy.  Hypertension -no home blood pressure readings for evaluation. denies any headache,dizziness,vision changes,chest tightness,palpitation,chest pain or shortness of breath.   No fall episodes reported.  Sleep no recent lab work over 1 year.  Health maintenance: No bone density available for review though thinks he recently had it done.  Discuss obtaining medical records from  previous provider and then will update.  Past Medical History:  Diagnosis Date   Breast cancer ([place])    Diabetes mellitus without complication ([place])    Gallstones    Per new patient packet   High cholesterol    Per new patient packet   Hypertension    Past Surgical History:  Procedure Laterality Date   CHOLECYSTECTOMY     CT SCAN  2023   [facility]/Per new patient packet   DIAGNOSTIC MAMMOGRAM  2023   [facility]/Per new patient packet    No Known Allergies  Allergies as of 10/24/2021   No Known Allergies      Medication List        Accurate as of Oct 24, 2021 10:32 AM. If you have any questions, ask your nurse or doctor.          atorvastatin 20 MG tablet Commonly known as: LIPITOR Take 20 mg by mouth daily.   b complex vitamins capsule Take 1 capsule by mouth daily.   B-12 1000 MCG Caps Take by mouth daily.   COLLAGEN EX Apply topically 3 (three) times daily.   empagliflozin 10 MG Tabs tablet Commonly known as: JARDIANCE Take by mouth daily.   gabapentin 100 MG capsule Commonly known as: NEURONTIN Take 100 mg by mouth 2 (two) times daily.   glipiZIDE 5 MG tablet Commonly known as: GLUCOTROL Take 5 mg by mouth daily.   isosorbide dinitrate 30 MG tablet Commonly known as: ISORDIL Take 30 mg by mouth daily.   metFORMIN 1000 MG tablet Commonly known as: GLUCOPHAGE Take 1,000 mg by mouth 2 (two) times daily.   mirtazapine 15  MG tablet Commonly known as: REMERON Take 15 mg by mouth daily.   MULTIVITAMIN GUMMIES WOMENS PO Take by mouth daily.   OMEGA-3 FISH OIL PO Take 2,000 mg by mouth 2 (two) times daily.   Rivaroxaban 15 MG Tabs tablet Commonly known as: XARELTO Take 15 mg by mouth in the morning.   traZODone 100 MG tablet Commonly known as: DESYREL Take 100 mg by mouth at bedtime. 3 at bedtime/Per new patient packet   VITAMIN B-2 PO Take 250 mg by mouth daily.   Vitamin D3 125 MCG (5000 UT)  Caps Take by mouth daily.   Zinc 20 MG Caps Take by mouth daily.        Review of Systems  Constitutional:  Negative for appetite change, chills, fatigue, fever and unexpected weight change.  HENT:  Negative for congestion, dental problem, ear discharge, ear pain, facial swelling, hearing loss, nosebleeds, postnasal drip, rhinorrhea, sinus pressure, sinus pain, sneezing, sore throat, tinnitus and trouble swallowing.   Eyes:  Positive for visual disturbance. Negative for pain, discharge, redness and itching.       Wears eye glasses   Respiratory:  Negative for cough, chest tightness, shortness of breath and wheezing.   Cardiovascular:  Negative for chest pain, palpitations and leg swelling.  Gastrointestinal:  Negative for abdominal distention, abdominal pain, blood in stool, constipation, diarrhea, nausea and vomiting.  Endocrine: Negative for cold intolerance, heat intolerance, polydipsia, polyphagia and polyuria.  Genitourinary:  Negative for difficulty urinating, dysuria, flank pain, frequency and urgency.       Wears incontinent pad sometimes   Musculoskeletal:  Negative for arthralgias, back pain, gait problem, joint swelling, myalgias, neck pain and neck stiffness.  Skin:  Negative for color change, pallor, rash and wound.  Neurological:  Positive for weakness. Negative for dizziness, syncope, speech difficulty, light-headedness, numbness and headaches.  Hematological:  Does not bruise/bleed easily.  Psychiatric/Behavioral:  Negative for agitation, behavioral problems, confusion, hallucinations, self-injury, sleep disturbance and suicidal ideas. The patient is not nervous/anxious.        6-7 hrs of sleep most ly in morning.    Immunization History  Administered Date(s) Administered   Fluad Quad(high Dose 65+) 02/20/2021   Influenza, High Dose Seasonal PF 03/16/2014, 02/22/2015, 04/07/2016, 04/05/2017, 04/04/2018   Influenza, Seasonal, Injecte, Preservative Fre 04/11/2013    Moderna SARS-COV2 Booster Vaccination 04/12/2020, 09/11/2020   Moderna Sars-Covid-2 Vaccination 07/31/2019, 08/25/2019   PNEUMOCOCCAL CONJUGATE-20 02/21/2021   Pfizer Covid-19 Vaccine Bivalent Booster 86yr & up 02/21/2021   Pneumococcal Conjugate-13 07/31/2013   Pneumococcal Polysaccharide-23 10/22/2014   Tdap 03/31/2007, 01/12/2011, 07/12/2021   Zoster Recombinat (Shingrix) 04/19/2018, 08/02/2018   Zoster, Live 01/23/2011   Pertinent  Health Maintenance Due  Topic Date Due   URINE MICROALBUMIN  Never done   DEXA SCAN  Never done   INFLUENZA VACCINE  01/06/2022      07/28/2019   12:25 PM 07/09/2020    9:42 PM 10/24/2021   10:14 [place]   1  Was there an injury with Fall?   0  Fall Risk Category Calculator   2  Fall Risk Category   Moderate  Patient Fall Risk Level Moderate fall risk Low fall risk Moderate fall risk  Patient at Risk for Falls Due to   History of fall(s)  Fall risk Follow up   Falls evaluation completed   Functional Status Survey:    Vitals:   10/24/21 1014  BP: 100/60  Pulse:  81  Temp: (!) 97 F (36.1 C)  SpO2: 97%  Weight: 123 lb 9.6 oz (56.1 kg)  Height: '5\' 2"'$  (1.575 m)   Body mass index is 22.61 kg/m. Physical Exam Vitals reviewed.  Constitutional:      General: She is not in acute distress.    Appearance: Normal appearance. She is normal weight. She is not ill-appearing or diaphoretic.  HENT:     Head: Normocephalic.     Right Ear: Tympanic membrane, ear canal and external ear normal. There is no impacted cerumen.     Left Ear: Tympanic membrane, ear canal and external ear normal. There is no impacted cerumen.     Nose: Nose normal. No congestion or rhinorrhea.     Mouth/Throat:     Mouth: Mucous membranes are moist.     Pharynx: Oropharynx is clear. No oropharyngeal exudate or posterior oropharyngeal erythema.  Eyes:     General: No scleral icterus.       Right eye: No discharge.        Left eye: No  discharge.     Extraocular Movements: Extraocular movements intact.     Conjunctiva/sclera: Conjunctivae normal.     Pupils: Pupils are equal, round, and reactive to light.  Neck:     Vascular: No carotid bruit.  Cardiovascular:     Rate and Rhythm: Normal rate and regular rhythm.     Pulses: Normal pulses.     Heart sounds: Normal heart sounds. No murmur heard.   No friction rub. No gallop.  Pulmonary:     Effort: Pulmonary effort is normal. No respiratory distress.     Breath sounds: Normal breath sounds. No wheezing, rhonchi or rales.  Chest:     Chest wall: No tenderness.  Abdominal:     General: Bowel sounds are normal. There is no distension.     Palpations: Abdomen is soft. There is no mass.     Tenderness: There is no abdominal tenderness. There is no right CVA tenderness, left CVA tenderness, guarding or rebound.  Musculoskeletal:        General: No swelling or tenderness. Normal range of motion.     Cervical back: Normal range of motion. No rigidity or tenderness.     Right lower leg: No edema.     Left lower leg: No edema.  Lymphadenopathy:     Cervical: No cervical adenopathy.  Skin:    General: Skin is warm and dry.     Coloration: Skin is not pale.     Findings: No bruising, erythema, lesion or rash.     Comments: Dark skin pigmentation on palm and heel areas due to chemo.  Right breast upper outer quadrant small shallow open wound.  Wound bed red in color without any drainage and surrounding skin tissue without any signs of infection.  Neurological:     Mental Status: She is alert and oriented to person, place, and time.     Cranial Nerves: No cranial nerve deficit.     Sensory: No sensory deficit.     Motor: No weakness.     Coordination: Coordination normal.     Gait: Gait normal.  Psychiatric:        Mood and Affect: Mood normal.        Speech: Speech normal.        Behavior: Behavior normal.        Thought Content: Thought content normal.         Judgment: Judgment normal.  Labs reviewed: No results for input(s): NA, K, CL, CO2, GLUCOSE, BUN, CREATININE, CALCIUM, MG, PHOS in the last 8760 hours. No results for input(s): AST, ALT, ALKPHOS, BILITOT, PROT, ALBUMIN in the last 8760 hours. No results for input(s): WBC, NEUTROABS, HGB, HCT, MCV, PLT in the last 8760 hours. No results found for: TSH No results found for: HGBA1C No results found for: CHOL, HDL, LDLCALC, LDLDIRECT, TRIG, CHOLHDL  Significant Diagnostic Results in last 30 days:  No results found.  Assessment/Plan  1. Encounter to establish care Available records reviewed, immunization up-to-date.  No bone density available for review.  Patient thinks had it 1 year ago.  Will obtain medical records then update.  Recommended scheduling for fasting lab work.  2. Primary hypertension Blood pressure well controlled -Continue on Isorbid - CBC with Differential/Platelet; Future - TSH; Future  3. Diabetes mellitus without complication (HCC) No latest A1c for review -No hypo or hyperglycemic symptoms reported -Continue on jaundice and metformin -Has upcoming appointment with ophthalmology December 09, 2021 -Foot exam intact today -Continue on statin for cardiovascular event prevention.On Xarelto for anticoagulation due to DVT - Microalbumin / creatinine urine ratio - Lipid panel; Future - Hemoglobin A1c; Future  4. Dysphagia, unspecified type Reports difficulty swallowing but denies any cough during meals. Recommend speech therapy evaluation of swallowing - Ambulatory referral to Speech Therapy  5. History of deep vein thrombosis (DVT) of lower extremity Negative for calf tenderness. -Continue on Xarelto  Family/ staff Communication: Reviewed plan of care with patient and sister verbalized understanding  Labs/tests ordered:  - Microalbumin / creatinine urine ratio - Lipid panel; Future - Hemoglobin A1c; Future - CBC with Differential/Platelet; Future - TSH;  Future  Next Appointment : 6 months for medical management of chronic issues.Fasting Labs in 1 week or sooner  Sandrea Hughs, NP

## 2021-10-25 LAB — MICROALBUMIN / CREATININE URINE RATIO
Creatinine, Urine: 51 mg/dL (ref 20–275)
Microalb Creat Ratio: 10 mcg/mg creat (ref <30)
Microalb, Ur: 0.5 mg/dL

## 2021-10-26 ENCOUNTER — Encounter: Payer: Self-pay | Admitting: Family

## 2021-10-26 DIAGNOSIS — Z86718 Personal history of other venous thrombosis and embolism: Secondary | ICD-10-CM | POA: Insufficient documentation

## 2021-10-27 ENCOUNTER — Telehealth: Payer: Self-pay

## 2021-10-27 ENCOUNTER — Other Ambulatory Visit: Payer: Self-pay | Admitting: Family

## 2021-10-27 DIAGNOSIS — R1310 Dysphagia, unspecified: Secondary | ICD-10-CM

## 2021-10-27 NOTE — Telephone Encounter (Signed)
-   Barium swallow study ordered  - Free style Elenor Legato requires frequent blood sugar check especial when on Insulin.

## 2021-10-28 NOTE — Telephone Encounter (Signed)
May refill if already using free style libre.

## 2021-10-29 ENCOUNTER — Other Ambulatory Visit (HOSPITAL_COMMUNITY): Payer: Self-pay

## 2021-10-29 DIAGNOSIS — R1310 Dysphagia, unspecified: Secondary | ICD-10-CM

## 2021-10-30 ENCOUNTER — Other Ambulatory Visit: Payer: Medicare Other

## 2021-10-30 DIAGNOSIS — E119 Type 2 diabetes mellitus without complications: Secondary | ICD-10-CM

## 2021-10-30 DIAGNOSIS — I10 Essential (primary) hypertension: Secondary | ICD-10-CM

## 2021-11-01 LAB — CBC WITH DIFFERENTIAL/PLATELET
Absolute Monocytes: 437 cells/uL (ref 200–950)
Basophils Absolute: 12 cells/uL (ref 0–200)
Basophils Relative: 0.3 %
Eosinophils Absolute: 39 cells/uL (ref 15–500)
Eosinophils Relative: 1 %
HCT: 27.7 % — ABNORMAL LOW (ref 35.0–45.0)
Hemoglobin: 9.3 g/dL — ABNORMAL LOW (ref 11.7–15.5)
Lymphs Abs: 577 cells/uL — ABNORMAL LOW (ref 850–3900)
MCH: 36.8 pg — ABNORMAL HIGH (ref 27.0–33.0)
MCHC: 33.6 g/dL (ref 32.0–36.0)
MCV: 109.5 fL — ABNORMAL HIGH (ref 80.0–100.0)
MPV: 9.7 fL (ref 7.5–12.5)
Monocytes Relative: 11.2 %
Neutro Abs: 2835 cells/uL (ref 1500–7800)
Neutrophils Relative %: 72.7 %
Platelets: 197 10*3/uL (ref 140–400)
RBC: 2.53 10*6/uL — ABNORMAL LOW (ref 3.80–5.10)
RDW: 15.7 % — ABNORMAL HIGH (ref 11.0–15.0)
Total Lymphocyte: 14.8 %
WBC: 3.9 10*3/uL (ref 3.8–10.8)

## 2021-11-01 LAB — HEMOGLOBIN A1C
Hgb A1c MFr Bld: 6.8 % of total Hgb — ABNORMAL HIGH (ref <5.7)
Mean Plasma Glucose: 148 mg/dL
eAG (mmol/L): 8.2 mmol/L

## 2021-11-01 LAB — TSH: TSH: 3.15 mIU/L (ref 0.40–4.50)

## 2021-11-01 LAB — LIPID PANEL
Cholesterol: 159 mg/dL (ref <200)
HDL: 87 mg/dL (ref >=50)
LDL Cholesterol (Calc): 59 mg/dL (calc)
Non-HDL Cholesterol (Calc): 72 mg/dL (calc) (ref <130)
Total CHOL/HDL Ratio: 1.8 (calc) (ref <5.0)
Triglycerides: 47 mg/dL (ref <150)

## 2021-11-01 LAB — TEST AUTHORIZATION

## 2021-11-01 LAB — VITAMIN B12: Vitamin B-12: 1078 pg/mL (ref 200–1100)

## 2021-11-12 ENCOUNTER — Telehealth: Payer: Self-pay | Admitting: *Deleted

## 2021-11-12 ENCOUNTER — Ambulatory Visit (HOSPITAL_COMMUNITY)
Admission: RE | Admit: 2021-11-12 | Discharge: 2021-11-12 | Disposition: A | Discharge status: Home / Self Care | Payer: Medicare Other | Source: Ambulatory Visit | Attending: Family | Admitting: Family

## 2021-11-12 DIAGNOSIS — E1142 Type 2 diabetes mellitus with diabetic polyneuropathy: Secondary | ICD-10-CM | POA: Diagnosis not present

## 2021-11-12 DIAGNOSIS — Z86718 Personal history of other venous thrombosis and embolism: Secondary | ICD-10-CM | POA: Insufficient documentation

## 2021-11-12 DIAGNOSIS — Z853 Personal history of malignant neoplasm of breast: Secondary | ICD-10-CM | POA: Diagnosis not present

## 2021-11-12 DIAGNOSIS — R531 Weakness: Secondary | ICD-10-CM | POA: Insufficient documentation

## 2021-11-12 DIAGNOSIS — R1310 Dysphagia, unspecified: Secondary | ICD-10-CM | POA: Insufficient documentation

## 2021-11-12 DIAGNOSIS — I10 Essential (primary) hypertension: Secondary | ICD-10-CM | POA: Diagnosis not present

## 2021-11-12 NOTE — Telephone Encounter (Signed)
Lorrene Reid, Speech Pathologist with [facility] called and left message on Clinical Intake stating that she needs to speak with Dinah directly regarding patient and her Modified Barium Swallow she had done. Stated that she had some concerns and requesting a call from you.   Cell #: [phone]  Pager: [phone]

## 2021-11-17 NOTE — Telephone Encounter (Signed)
Call returned to [facility] at [phone] but did not answer.Message left on voicemail to call provider at office number [phone]

## 2021-11-18 ENCOUNTER — Encounter: Payer: Self-pay | Admitting: Family

## 2021-11-18 ENCOUNTER — Ambulatory Visit (INDEPENDENT_AMBULATORY_CARE_PROVIDER_SITE_OTHER): Payer: Medicare Other | Admitting: Family

## 2021-11-18 VITALS — BP 114/70 | HR 88 | Temp 97.4°F | Resp 16 | Ht 62.0 in | Wt 122.4 lb

## 2021-11-18 DIAGNOSIS — F5101 Primary insomnia: Secondary | ICD-10-CM | POA: Diagnosis not present

## 2021-11-18 DIAGNOSIS — E119 Type 2 diabetes mellitus without complications: Secondary | ICD-10-CM

## 2021-11-18 DIAGNOSIS — R1310 Dysphagia, unspecified: Secondary | ICD-10-CM | POA: Diagnosis not present

## 2021-11-18 MED ORDER — METFORMIN HCL 500 MG PO TABS: 500.0000 mg | ORAL_TABLET | Freq: Two times a day (BID) | ORAL | 1 refills | Status: AC

## 2021-11-18 MED ORDER — TRAZODONE HCL 100 MG PO TABS: 100.0000 mg | ORAL_TABLET | Freq: Every day | ORAL | 1 refills | Status: DC

## 2021-11-18 NOTE — Progress Notes (Signed)
Provider: Marlowe Sax FNP-C  Viktoria Gruetzmacher, Nelda Bucks, NP  Patient Care Team: Ambrea Hegler, Nelda Bucks, NP as PCP - General (Family Medicine)  Extended Emergency Contact Information Primary Emergency Contact: Booker,Kimberly Mobile Phone: [phone] Relation: Daughter Secondary Emergency Contact: Smith,Arcelia Mobile Phone: [phone] Relation: Sister  Code Status: Full Code  Goals of care: Advanced Directive information    11/18/2021   11:22 AM  Advanced Directives  Does Patient Have a Medical Advance Directive? No  Would patient like information on creating a medical advance directive? No - Patient declined     Chief Complaint  Patient presents with   Acute Visit    Patient is here because  they have questions/concerns about cancer medication and medications that support cancer medication.    HPI:  Pt is a 88 y.o. female seen today for an acute visit for medication concerns and discussed recent barium swallow study results.she is here with her daughter.Wonders whether she can take her Orserdu for cancer with her OTC supplement.states has not started her Orserdu. Discussed with patient and daughter to take OTC supplement at least 2 hrs away from new medication.  She had Modified Barium swallow study done due to complains of coughing,difficulties swallowing and changes in speech x 2 months.states food get stuck on the left side in the back of her throat.Barium swallow showed no intratracheal aspiration.Lorrene Reid speech Pathologist called [facility] let voicemail to staff wanted to speak with provider regarding concerns on Patient's modified Barium swallow.Phone called returned but no answer voicemail left to call provider.  Discussed results with patient and daughter who states was told by pathologist swallowing difficulties were possible Neurologic related recommended follow up with a Neurologist for further evaluation possible hypoglossal deficit.    Past Medical History:   Diagnosis Date   Breast cancer ([place])    Diabetes mellitus without complication ([place])    Gallstones    Per new patient packet   High cholesterol    Per new patient packet   History of deep vein thrombosis (DVT) of lower extremity    Hypertension    Past Surgical History:  Procedure Laterality Date   CHOLECYSTECTOMY     CT SCAN  2023   [place] CT/Per new patient packet   DIAGNOSTIC MAMMOGRAM  2023   [place], Breast clinic/Per new patient packet    No Known Allergies  Outpatient Encounter Medications as of 11/18/2021  Medication Sig   atorvastatin (LIPITOR) 20 MG tablet Take 20 mg by mouth daily.   b complex vitamins capsule Take 1 capsule by mouth daily.   Chlorophyll (CHLOROXYGEN PO) Take 1 capsule by mouth 2 (two) times daily.   Cholecalciferol (VITAMIN D3) 125 MCG (5000 UT) CAPS Take by mouth daily.   Cyanocobalamin (B-12) 1000 MCG CAPS Take by mouth daily.   elacestrant hydrochloride (ORSERDU) 345 MG tablet Take 345 mg by mouth daily. Take with food.   Emollient (COLLAGEN EX) Apply topically 3 (three) times daily.   empagliflozin (JARDIANCE) 10 MG TABS tablet Take by mouth daily.   gabapentin (NEURONTIN) 100 MG capsule Take 100 mg by mouth 2 (two) times daily.   glipiZIDE (GLUCOTROL) 5 MG tablet Take 5 mg by mouth daily.   isosorbide dinitrate (ISORDIL) 30 MG tablet Take 30 mg by mouth daily.   metFORMIN (GLUCOPHAGE) 1000 MG tablet Take 1,000 mg by mouth 2 (two) times daily.   mirtazapine (REMERON) 15 MG tablet Take 15 mg by mouth daily.   Multiple Vitamins-Minerals (MULTIVITAMIN GUMMIES WOMENS  PO) Take by mouth daily.   Omega-3 Fatty Acids (OMEGA-3 FISH OIL PO) Take 2,000 mg by mouth 2 (two) times daily.   OVER THE COUNTER MEDICATION Take 1 capsule by mouth 2 (two) times daily. Sucontral D ( Blood Sugar Balance )   Riboflavin (VITAMIN B-2 PO) Take 250 mg by mouth daily.   Rivaroxaban (XARELTO) 15 MG TABS tablet Take 15 mg by mouth in the morning.    traZODone (DESYREL) 100 MG tablet Take 100 mg by mouth at bedtime. 3 at bedtime/Per new patient packet   Zinc 20 MG CAPS Take by mouth daily.   No facility-administered encounter medications on file as of 11/18/2021.    Review of Systems  Constitutional:  Negative for appetite change, chills, fatigue and fever.  HENT:  Positive for trouble swallowing. Negative for congestion, rhinorrhea, sinus pressure, sinus pain, sneezing, sore throat, tinnitus and voice change.   Eyes:  Negative for discharge, redness and itching.  Respiratory:  Negative for cough, chest tightness, shortness of breath and wheezing.   Cardiovascular:  Negative for chest pain, palpitations and leg swelling.  Gastrointestinal:  Negative for abdominal distention, abdominal pain, constipation, diarrhea, nausea and vomiting.  Skin:  Negative for color change, pallor and rash.  Neurological:  Negative for dizziness, tremors, syncope, speech difficulty, weakness, light-headedness, numbness and headaches.  Psychiatric/Behavioral:  Positive for sleep disturbance. Negative for agitation, behavioral problems, confusion and hallucinations. The patient is not nervous/anxious.     Immunization History  Administered Date(s) Administered   Fluad Quad(high Dose 65+) 02/20/2021   Influenza, High Dose Seasonal PF 03/16/2014, 02/22/2015, 04/07/2016, 04/05/2017, 04/04/2018   Influenza, Seasonal, Injecte, Preservative Fre 04/11/2013   Moderna SARS-COV2 Booster Vaccination 04/12/2020, 09/11/2020   Moderna Sars-Covid-2 Vaccination 07/31/2019, 08/25/2019   PNEUMOCOCCAL CONJUGATE-20 02/21/2021   Pfizer Covid-19 Vaccine Bivalent Booster 104yr & up 02/21/2021, 10/20/2021   Pneumococcal Conjugate-13 07/31/2013   Pneumococcal Polysaccharide-23 10/22/2014   Tdap 03/31/2007, 01/12/2011, 07/12/2021   Zoster Recombinat (Shingrix) 04/19/2018, 08/02/2018   Zoster, Live 01/23/2011   Pertinent  Health Maintenance Due  Topic Date Due   OPHTHALMOLOGY  EXAM  Never done   DEXA SCAN  Never done   INFLUENZA VACCINE  01/06/2022   HEMOGLOBIN A1C  05/02/2022   FOOT EXAM  10/25/2022   URINE MICROALBUMIN  10/25/2022      07/28/2019   12:25 PM 07/09/2020    9:42 PM 10/24/2021   10:14 AM 11/18/2021   11:22 AM  Fall Risk  Falls in the past year?   1 0  Was there an injury with Fall?   0 0  Fall Risk Category Calculator   2 0  Fall Risk Category   Moderate Low  Patient Fall Risk Level Moderate fall risk Low fall risk Moderate fall risk Low fall risk  Patient at Risk for Falls Due to   History of fall(s) No Fall Risks  Fall risk Follow up   Falls evaluation completed Falls evaluation completed   Functional Status Survey:    Vitals:   11/18/21 1113  BP: 114/70  Pulse: 88  Resp: 16  Temp: (!) 97.4 F (36.3 C)  SpO2: 95%  Weight: 122 lb 6.4 oz (55.5 kg)  Height: '5\' 2"'$  (1.575 m)   Body mass index is 22.39 kg/m. Physical Exam Vitals reviewed.  Constitutional:      General: She is not in acute distress.    Appearance: Normal appearance. She is normal weight. She is not ill-appearing or diaphoretic.  HENT:  Head: Normocephalic.     Nose: Nose normal. No congestion or rhinorrhea.     Mouth/Throat:     Mouth: Mucous membranes are moist.     Pharynx: Oropharynx is clear. No oropharyngeal exudate or posterior oropharyngeal erythema.  Eyes:     General: No scleral icterus.       Right eye: No discharge.        Left eye: No discharge.     Extraocular Movements: Extraocular movements intact.     Conjunctiva/sclera: Conjunctivae normal.     Pupils: Pupils are equal, round, and reactive to light.  Neck:     Vascular: No carotid bruit.  Cardiovascular:     Rate and Rhythm: Normal rate and regular rhythm.     Pulses: Normal pulses.     Heart sounds: Normal heart sounds. No murmur heard.    No friction rub. No gallop.  Pulmonary:     Effort: Pulmonary effort is normal. No respiratory distress.     Breath sounds: Normal breath sounds.  No wheezing, rhonchi or rales.  Chest:     Chest wall: No tenderness.  Abdominal:     General: Bowel sounds are normal. There is no distension.     Palpations: Abdomen is soft. There is no mass.     Tenderness: There is no abdominal tenderness. There is no right CVA tenderness, left CVA tenderness, guarding or rebound.  Musculoskeletal:        General: No swelling or tenderness. Normal range of motion.     Cervical back: Normal range of motion. No rigidity or tenderness.     Right lower leg: No edema.     Left lower leg: No edema.  Lymphadenopathy:     Cervical: No cervical adenopathy.  Skin:    General: Skin is warm and dry.     Coloration: Skin is not pale.     Findings: No bruising, erythema, lesion or rash.  Neurological:     Mental Status: She is alert and oriented to person, place, and time.     Sensory: No sensory deficit.     Motor: No weakness.     Coordination: Coordination normal.     Gait: Gait normal.  Psychiatric:        Mood and Affect: Mood normal.        Speech: Speech normal.        Behavior: Behavior normal.     Labs reviewed: No results for input(s): "NA", "K", "CL", "CO2", "GLUCOSE", "BUN", "CREATININE", "CALCIUM", "MG", "PHOS" in the last 8760 hours. No results for input(s): "AST", "ALT", "ALKPHOS", "BILITOT", "PROT", "ALBUMIN" in the last 8760 hours. Recent Labs    10/30/21 0915  WBC 3.9  NEUTROABS 2,835  HGB 9.3*  HCT 27.7*  MCV 109.5*  PLT 197   Lab Results  Component Value Date   TSH 3.15 10/30/2021   Lab Results  Component Value Date   HGBA1C 6.8 (H) 10/30/2021   Lab Results  Component Value Date   CHOL 159 10/30/2021   HDL 87 10/30/2021   LDLCALC 59 10/30/2021   TRIG 47 10/30/2021   CHOLHDL 1.8 10/30/2021    Significant Diagnostic Results in last 30 days:  DG SWALLOW FUNC OP MEDICARE SPEECH PATH  Result Date: 11/12/2021 Table formatting from the original result was not included. Images from the original result were not  included. Objective Swallowing Evaluation: Type of Study: MBS-Modified Barium Swallow Study  Patient Details Name: Kristina Jackson MRN: [id number] Date of Birth: 04-30-1931  Today's Date: 11/12/2021 Time: SLP Start Time (ACUTE ONLY): 1400 -SLP Stop Time (ACUTE ONLY): 1435 SLP Time Calculation (min) (ACUTE ONLY): 35 min Past Medical History: Past Medical History: Diagnosis Date  Breast cancer ([place])   Diabetes mellitus without complication ([place])   Gallstones   Per new patient packet  High cholesterol   Per new patient packet  History of deep vein thrombosis (DVT) of lower extremity   Hypertension  Past Surgical History: Past Surgical History: Procedure Laterality Date  CHOLECYSTECTOMY    CT SCAN  2023  [place] CT/Per new patient packet  DIAGNOSTIC MAMMOGRAM  2023  [place], Breast clinic/Per new patient packet HPI: 88 yo female referred by NP Ildefonso Keaney for OP MBS study. Pt has h/o DM2, HTN, DVT,fall 2021 with negative brian image, breast cancer s/p chemoradiation with peripheral neuropathy as complication, DVT.  She reports suddent onset of dysphagia, lingual weakness, oral pocketing of food on left posterior oral cavity without ability to clear with lingual sweep, speech disturbances and decreased phonatory/cough strength.  Reports these symptoms have all remained stable but sudden onset.  Pt uses a straw on her right side due to decreased labial seal on left. She is accompanied by her daughter-in-law Maudie Mercury.  Subjective: pt awake in chair  Recommendations for follow up therapy are one component of a multi-disciplinary discharge planning process, led by the attending physician.  Recommendations may be updated based on patient status, additional functional criteria and insurance authorization. Assessment / Plan / Recommendation   11/12/2021   2:00 PM Clinical Impressions Clinical Impression Patient presents with multiple cranial nerve deficits during oral motor exam, which she reports suddenly occurred  approximately 2 months ago and have not worsened or improved.  Facial, trigeminal and hypoglossal deficits noted concerning for, question if potential neuro source.  Swallow function was mildly orally impaired with prolonged oral transit of solid/pudding. Piecemealing noted occasionally which was effective for pt.  Pharyngeal swallow was [facility] with only minimal retention of liquids at vallecular space.  A-P view revealed bilateral minimal retention.  Head turn to the left did not prevent minimal retention.   Pt did not cough nor "choke" during exam and was observed with sequential liquids. She did report sensation of tablet lodging in pharynx on left side - when tablet appeared in the distal esophagus. Recommend pt continue po diet as tolerated, masticate on her right side, use straws on right side, start intake with liquids, add extra gravies/sauces to foods to ease oral transiting and use finger sweep to clear oral retention on left.   All education completed with pt and her daughter-in-law Maudie Mercury.  Thanks for this consult.  SLP phoned MD office and left voice mail requesting call back regarding pt's sudden symptom onset. SLP Visit Diagnosis Dysphagia, oral phase (R13.11) Impact on safety and function Mild aspiration risk      View : No data to display.         View : No data to display.      11/12/2021   2:00 PM Diet Recommendations SLP Diet Recommendations Regular solids;Thin liquid Liquid Administration via Straw Medication Administration Whole meds with liquid Compensations Slow rate;Small sips/bites Postural Changes Remain semi-upright after after feeds/meals (Comment);Seated upright at 90 degrees      View : No data to display.       View : No data to display.        11/12/2021   2:00 PM Oral Phase Oral Phase Impaired Oral - Nectar  Straw WFL Oral - Thin Cup WFL Oral - Thin Straw WFL Oral - Puree Delayed oral transit Oral - Mech Soft Delayed oral transit;Decreased bolus cohesion Oral - Pill Delayed oral  transit;Decreased bolus cohesion    11/12/2021   2:00 PM Pharyngeal Phase Pharyngeal Phase Impaired Pharyngeal- Nectar Teaspoon WFL;Pharyngeal residue - valleculae Pharyngeal Material does not enter airway Pharyngeal- Nectar Straw WFL;Pharyngeal residue - valleculae Pharyngeal Material does not enter airway Pharyngeal- Thin Teaspoon WFL;Pharyngeal residue - valleculae Pharyngeal Material does not enter airway Pharyngeal- Thin Cup [facility];Pharyngeal residue - valleculae Pharyngeal Material does not enter airway Pharyngeal- Thin Straw WFL;Pharyngeal residue - valleculae Pharyngeal Material does not enter airway Pharyngeal- Puree WFL Pharyngeal- Mechanical Soft WFL Pharyngeal- Pill WFL Pharyngeal Comment Minimal retention in vallecular region noted - bilaterally in A-P view    11/12/2021   2:00 PM Cervical Esophageal Phase  Cervical Esophageal Phase [facility] Cervical Esophageal Comment Patient reported sensation of retention in pharynx of barium tablet taken with thin; Pharynx was clear - and barium tablet appeared distally in esophagus.  Further barium boluses appeared to faciliate clearance. MBS is not designed to evaluate esophageal swallow function.  Suspect referannt sensation to pharynx. Macario Golds 11/12/2021, 4:06 PM  Kathleen Lime, MS [facility] SLP Acute Rehab Services Office [phone] Pager [phone] CLINICAL DATA:  Dysphagia. EXAM: MODIFIED BARIUM SWALLOW TECHNIQUE: Different consistencies of barium were administered orally to the patient by the Speech Pathologist. Imaging of the pharynx was performed in the lateral projection. The radiologist was present in the fluoroscopy room for this study, providing personal supervision. FLUOROSCOPY: Radiation Exposure Index (as provided by the fluoroscopic device): 24.9 mGy [place] COMPARISON:  None Available. FINDINGS: Vestibular  Penetration:  None seen. Aspiration:  None seen. Other: There are anterior osteophytes at C5-C6 and C6-C7 resulting in mild indentation of the posterior  esophagus. IMPRESSION: No intratracheal aspiration. Please refer to the Speech Pathologists report for complete details and recommendations. Electronically Signed   By: Maurine Simmering M.D.   On: 11/12/2021 15:20    Assessment/Plan 1. Diabetes mellitus without complication ([place]) Lab Results  Component Value Date   HGBA1C 6.8 (H) 10/30/2021  Well controlled  CBG in the 80's-113  Will reduce metformin from 1000 mg twice daily to 500 mg tablet twice daily. - Advised to monitor CBG and daughter to send readings to provider via [place] in 2 weeks. - metFORMIN (GLUCOPHAGE) 500 MG tablet; Take 1 tablet (500 mg total) by mouth 2 (two) times daily.  Dispense: 90 tablet; Refill: 1  2. Dysphagia, unspecified type Modified Barium swallow study showed no signs of aspiration but speech pathologist concerned possible Neurological causes of pocketing food on left side possible Hypoglossal deficit recommended follow up with a Neurologist for further evaluation possible hypoglossal deficit.  - Ambulatory referral to Speech Therapy - Ambulatory referral to Neurology  3. Primary insomnia Trazodone effective.will refill med.  - traZODone (DESYREL) 100 MG tablet; Take 1 tablet (100 mg total) by mouth at bedtime.  Dispense: 90 tablet; Refill: 1  Family/ staff Communication: Reviewed plan of care with patient and daughter verbalized understanding   Labs/tests ordered: None   Next Appointment: Return if symptoms worsen or fail to improve.   Sandrea Hughs, NP

## 2021-11-25 ENCOUNTER — Other Ambulatory Visit: Payer: Self-pay

## 2021-11-25 DIAGNOSIS — F5101 Primary insomnia: Secondary | ICD-10-CM

## 2021-11-25 MED ORDER — GABAPENTIN 100 MG PO CAPS: 100.0000 mg | ORAL_CAPSULE | Freq: Two times a day (BID) | ORAL | 1 refills | Status: AC

## 2021-12-01 ENCOUNTER — Ambulatory Visit: Payer: Medicare Other | Attending: Audiologist | Admitting: Audiologist

## 2021-12-01 ENCOUNTER — Encounter: Payer: Self-pay | Admitting: Neurology

## 2021-12-01 ENCOUNTER — Other Ambulatory Visit (INDEPENDENT_AMBULATORY_CARE_PROVIDER_SITE_OTHER): Payer: Medicare Other

## 2021-12-01 ENCOUNTER — Ambulatory Visit (INDEPENDENT_AMBULATORY_CARE_PROVIDER_SITE_OTHER): Payer: Medicare Other | Admitting: Neurology

## 2021-12-01 VITALS — BP 99/54 | HR 89 | Ht <= 58 in | Wt 123.8 lb

## 2021-12-01 DIAGNOSIS — H02401 Unspecified ptosis of right eyelid: Secondary | ICD-10-CM

## 2021-12-01 DIAGNOSIS — H903 Sensorineural hearing loss, bilateral: Secondary | ICD-10-CM | POA: Insufficient documentation

## 2021-12-01 DIAGNOSIS — R1310 Dysphagia, unspecified: Secondary | ICD-10-CM

## 2021-12-02 ENCOUNTER — Ambulatory Visit: Payer: Medicare Other

## 2021-12-02 ENCOUNTER — Telehealth: Payer: Self-pay | Admitting: *Deleted

## 2021-12-05 NOTE — Patient Instructions (Signed)
Please contact your local pharmacy, previous provider, or insurance carrier for vaccine/immunization records. Ensure that any procedures done outside of [facility] are faxed to [place] [phone] or you can sign release of records form at the front desk to keep your medical record updated.    - Knee high compression stockings on in the morning and off at bedtime  - Notify provider for any B/p low blood pressure reading less than her baseline 80/60 8-follow-up     Positive for long walks exercise Patient has an appointment 2020 okay thank you

## 2021-12-08 ENCOUNTER — Ambulatory Visit (INDEPENDENT_AMBULATORY_CARE_PROVIDER_SITE_OTHER): Payer: Medicare Other | Admitting: Family

## 2021-12-08 ENCOUNTER — Encounter: Payer: Self-pay | Admitting: Family

## 2021-12-08 VITALS — BP 110/58 | Temp 98.2°F | Ht <= 58 in | Wt 124.2 lb

## 2021-12-08 DIAGNOSIS — Z8673 Personal history of transient ischemic attack (TIA), and cerebral infarction without residual deficits: Secondary | ICD-10-CM | POA: Diagnosis not present

## 2021-12-08 DIAGNOSIS — E119 Type 2 diabetes mellitus without complications: Secondary | ICD-10-CM

## 2021-12-08 DIAGNOSIS — I10 Essential (primary) hypertension: Secondary | ICD-10-CM | POA: Diagnosis not present

## 2021-12-08 DIAGNOSIS — Z86718 Personal history of other venous thrombosis and embolism: Secondary | ICD-10-CM | POA: Diagnosis not present

## 2021-12-08 DIAGNOSIS — R600 Localized edema: Secondary | ICD-10-CM

## 2021-12-08 NOTE — Progress Notes (Signed)
Provider: Marlowe Sax FNP-C  Rossana Molchan, Nelda Bucks, NP  Patient Care Team: Trenita Hulme, Nelda Bucks, NP as PCP - General (Family Medicine)  Extended Emergency Contact Information Primary Emergency Contact: [place] III Mobile Phone: [phone] Relation: Son Secondary Emergency Contact: Booker,Kimberly Mobile Phone: [phone] Relation: Niece  Code Status:  Full Code  Goals of care: Advanced Directive information    12/08/2021    9:18 AM  Advanced Directives  Does Patient Have a Medical Advance Directive? No  Would patient like information on creating a medical advance directive? No - Patient declined     Chief Complaint  Patient presents with   Transitions Of Care    [facility] 12/01/2021-12/04/2021    HPI:  Pt is a 88 y.o. 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  She complained of headache on and off for the past 2 weeks: Difficult gait and difficulty swallowing for 2 to 3 weeks with slurred speech.  Has had a bilateral stroke was thought possible from underlying metastatic disease versus cardioembolic from A-fib.  She has continued on Xarelto.  TTE with bubble study showed EF 44% severe RV systolic dysfunction, moderate valvular regurgitation, severe pulmonary hypertension.  Bilateral carotid ultrasound was negative for significant blockage. Speech therapy was consulted she was discharged home with home health.  She was advised to follow-up outpatient neurology Eliquis versus Lovenox recommended in place of Xarelto given the history of underlying malignancy and PE  was advised to discuss coagulation with her oncologist. Advised to continue on statin - glipizide was discontinued. 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   Past Medical History:  Diagnosis Date   Breast cancer ([place])    Diabetes mellitus without complication ([place])    Gallstones    Per new patient packet   High cholesterol    Per new patient packet   History of deep vein thrombosis (DVT) of lower extremity    Hypertension    Stroke [facility])    Past Surgical History:  Procedure Laterality Date   CHOLECYSTECTOMY     CT SCAN  2023   [facility]/Per new patient packet   DIAGNOSTIC MAMMOGRAM  2023   [place] clinic/Per new patient packet    No Known Allergies  Outpatient Encounter Medications as of 12/08/2021  Medication Sig   atorvastatin (LIPITOR) 20 MG tablet Take 20 mg by mouth daily.   b complex vitamins capsule Take 1 capsule by mouth daily.   Chlorophyll (CHLOROXYGEN PO) Take 1 capsule by mouth 2 (two) times daily.   Cholecalciferol (VITAMIN D3) 125 MCG (5000 UT) CAPS Take by mouth daily.   Cyanocobalamin (B-12) 1000 MCG CAPS Take by mouth daily.   elacestrant hydrochloride (  ORSERDU) 345 MG tablet Take 345 mg by mouth daily. Take with food.   Emollient (COLLAGEN EX) Apply topically 3 (three) times daily.   empagliflozin (JARDIANCE) 10 MG TABS tablet Take by mouth daily.   gabapentin (NEURONTIN) 100 MG capsule Take 1 capsule (100 mg total) by mouth 2 (two) times daily.   isosorbide dinitrate  (ISORDIL) 30 MG tablet Take 30 mg by mouth daily.   metFORMIN (GLUCOPHAGE) 500 MG tablet Take 1 tablet (500 mg total) by mouth 2 (two) times daily. (Patient taking differently: Take 500 mg by mouth daily.)   mirtazapine (REMERON) 15 MG tablet Take 15 mg by mouth daily.   Multiple Vitamins-Minerals (MULTIVITAMIN GUMMIES WOMENS PO) Take by mouth daily.   Omega-3 Fatty Acids (OMEGA-3 FISH OIL PO) Take 2,000 mg by mouth 2 (two) times daily.   OVER THE COUNTER MEDICATION Take 1 capsule by mouth 2 (two) times daily. Sucontral D ( Blood Sugar Balance )   Riboflavin (VITAMIN B-2 PO) Take 250 mg by mouth daily.   Rivaroxaban (XARELTO) 15 MG TABS tablet Take 15 mg by mouth in the morning.   traZODone (DESYREL) 100 MG tablet Take 100 mg by mouth at bedtime.   Zinc 20 MG CAPS Take by mouth daily.   [DISCONTINUED] glipiZIDE (GLUCOTROL) 5 MG tablet Take 5 mg by mouth daily.   No facility-administered encounter medications on file as of 12/08/2021.    Review of Systems  Constitutional:  Negative for appetite change, chills, fatigue, fever and unexpected weight change.  HENT:  Negative for congestion, dental problem, ear discharge, ear pain, facial swelling, hearing loss, nosebleeds, postnasal drip, rhinorrhea, sinus pressure, sinus pain, sneezing, sore throat and trouble swallowing.   Eyes:  Negative for pain, discharge, redness, itching and visual disturbance.  Respiratory:  Negative for cough, chest tightness, shortness of breath and wheezing.   Cardiovascular:  Negative for chest pain, palpitations and leg swelling.  Gastrointestinal:  Negative for abdominal distention, abdominal pain, blood in stool, constipation, diarrhea, nausea and vomiting.  Endocrine: Negative for cold intolerance, heat intolerance, polydipsia, polyphagia and polyuria.  Genitourinary:  Negative for difficulty urinating, dysuria, flank pain, frequency and urgency.  Musculoskeletal:  Negative for arthralgias, back pain, gait problem,  joint swelling, myalgias, neck pain and neck stiffness.  Skin:  Negative for color change, pallor, rash and wound.  Neurological:  Positive for speech difficulty. Negative for dizziness, syncope, weakness, light-headedness, numbness and headaches.  Hematological:  Does not bruise/bleed easily.  Psychiatric/Behavioral:  Negative for agitation, behavioral problems, confusion, hallucinations and sleep disturbance. The patient is not nervous/anxious.     Immunization History  Administered Date(s) Administered   Fluad Quad(high Dose 65+) 02/20/2021   Influenza, High Dose Seasonal PF 03/16/2014, 02/22/2015, 04/07/2016, 04/05/2017, 04/04/2018   Influenza, Seasonal, Injecte, Preservative Fre 04/11/2013   Moderna SARS-COV2 Booster Vaccination 04/12/2020, 09/11/2020   Moderna Sars-Covid-2 Vaccination 07/31/2019, 08/25/2019   PNEUMOCOCCAL CONJUGATE-20 02/21/2021   Pfizer Covid-19 Vaccine Bivalent Booster 75yr & up 02/21/2021, 10/20/2021   Pneumococcal Conjugate-13 07/31/2013   Pneumococcal Polysaccharide-23 10/22/2014   Tdap 03/31/2007, 01/12/2011, 07/12/2021   Zoster Recombinat (Shingrix) 04/19/2018, 08/02/2018   Zoster, Live 01/23/2011   Pertinent  Health Maintenance Due  Topic Date Due   OPHTHALMOLOGY EXAM  Never done   DEXA SCAN  Never done   INFLUENZA VACCINE  01/06/2022   HEMOGLOBIN A1C  05/02/2022   FOOT EXAM  10/25/2022   URINE MICROALBUMIN  10/25/2022      07/09/2020    9:42 PM 10/24/2021  10:14 AM 11/18/2021   11:22 AM 12/01/2021    1:07 PM 12/08/2021    1:04 PM  Fall Risk  Falls in the past year?  1 0 1 0  Was there an injury with Fall?  0 0 0 0  Fall Risk Category Calculator  2 0 1 0  Fall Risk Category  Moderate Low Low Low  Patient Fall Risk Level Low fall risk Moderate fall risk Low fall risk Low fall risk Low fall risk  Patient at Risk for Falls Due to  History of fall(s) No Fall Risks  No Fall Risks  Fall risk Follow up  Falls evaluation completed Falls evaluation  completed  Falls evaluation completed   Functional Status Survey:    There were no vitals filed for this visit. There is no height or weight on file to calculate BMI. Physical Exam Vitals reviewed.  Constitutional:      General: She is not in acute distress.    Appearance: Normal appearance. She is normal weight. She is not ill-appearing or diaphoretic.  HENT:     Head: Normocephalic.     Right Ear: Tympanic membrane, ear canal and external ear normal. There is no impacted cerumen.     Left Ear: Tympanic membrane, ear canal and external ear normal. There is no impacted cerumen.     Nose: Nose normal. No congestion or rhinorrhea.     Mouth/Throat:     Mouth: Mucous membranes are moist.     Pharynx: Oropharynx is clear. No oropharyngeal exudate or posterior oropharyngeal erythema.  Eyes:     General: No scleral icterus.       Right eye: No discharge.        Left eye: No discharge.     Conjunctiva/sclera: Conjunctivae normal.     Pupils: Pupils are equal, round, and reactive to light.  Neck:     Vascular: No carotid bruit.  Cardiovascular:     Rate and Rhythm: Normal rate and regular rhythm.     Pulses: Normal pulses.     Heart sounds: Normal heart sounds. No murmur heard.    No friction rub. No gallop.  Pulmonary:     Effort: Pulmonary effort is normal. No respiratory distress.     Breath sounds: Normal breath sounds. No wheezing, rhonchi or rales.  Chest:     Chest wall: No tenderness.  Abdominal:     General: Bowel sounds are normal. There is no distension.     Palpations: Abdomen is soft. There is no mass.     Tenderness: There is no abdominal tenderness. There is no right CVA tenderness, left CVA tenderness, guarding or rebound.  Musculoskeletal:        General: No swelling or tenderness. Normal range of motion.     Cervical back: Normal range of motion. No rigidity or tenderness.     Right lower leg: No edema.     Left lower leg: No edema.  Lymphadenopathy:      Cervical: No cervical adenopathy.  Skin:    General: Skin is warm and dry.     Coloration: Skin is not pale.     Findings: No bruising, erythema, lesion or rash.  Neurological:     Mental Status: She is alert. Mental status is at baseline.     Cranial Nerves: No cranial nerve deficit.     Sensory: No sensory deficit.     Motor: No weakness.     Coordination: Coordination normal.  Gait: Gait normal.  Psychiatric:        Mood and Affect: Mood normal.        Speech: Speech normal.        Behavior: Behavior normal.        Thought Content: Thought content normal.        Judgment: Judgment normal.     Labs reviewed: No results for input(s): "NA", "K", "CL", "CO2", "GLUCOSE", "BUN", "CREATININE", "CALCIUM", "MG", "PHOS" in the last 8760 hours. No results for input(s): "AST", "ALT", "ALKPHOS", "BILITOT", "PROT", "ALBUMIN" in the last 8760 hours. Recent Labs    10/30/21 0915  WBC 3.9  NEUTROABS 2,835  HGB 9.3*  HCT 27.7*  MCV 109.5*  PLT 197   Lab Results  Component Value Date   TSH 3.15 10/30/2021   Lab Results  Component Value Date   HGBA1C 6.8 (H) 10/30/2021   Lab Results  Component Value Date   CHOL 159 10/30/2021   HDL 87 10/30/2021   LDLCALC 59 10/30/2021   TRIG 47 10/30/2021   CHOLHDL 1.8 10/30/2021    Significant Diagnostic Results in last 30 days:  Leanne Chang OP MEDICARE SPEECH PATH  Addendum Date: 11/24/2021   ADDENDUM REPORT: 11/12/2021 15:37 ADDENDUM: Narda Rutherford, NP was present in the fluoroscopy room during this study, which was supervised and interpreted by Maurine Simmering, MD. Electronically Signed   By: Maurine Simmering M.D.   On: 11/12/2021 15:37   Result Date: 11/12/2021 CLINICAL DATA:  Dysphagia. EXAM: MODIFIED BARIUM SWALLOW TECHNIQUE: Different consistencies of barium were administered orally to the patient by the Speech Pathologist. Imaging of the pharynx was performed in the lateral projection. The radiologist was present in the fluoroscopy room for  this study, providing personal supervision. FLUOROSCOPY: Radiation Exposure Index (as provided by the fluoroscopic device): 24.9 mGy [place] COMPARISON:  None Available. FINDINGS: Vestibular  Penetration:  None seen. Aspiration:  None seen. Other: There are anterior osteophytes at C5-C6 and C6-C7 resulting in mild indentation of the posterior esophagus. IMPRESSION: No intratracheal aspiration. Please refer to the Speech Pathologists report for complete details and recommendations. Electronically Signed: By: Maurine Simmering M.D. On: 11/12/2021 15:20    Assessment/Plan 1. Diabetes mellitus without complication ([place]) Lab Results  Component Value Date   HGBA1C 6.8 (H) 10/30/2021  -Continue on metformin 500 once daily and Jardiance 10 mg daily -Advised to notify provider if CBGs are consistently greater than 200 - CBC with Differential/Platelet - BMP with eGFR([facility])  2. Primary hypertension Blood pressure well controlled -Continue on isosorbide - CBC with Differential/Platelet - BMP with eGFR([facility])  3. History of deep vein thrombosis (DVT) of lower extremity Continue on Xarelto  4. History of CVA (cerebrovascular accident) Status post hospitalization as above MRI indicated acute to subacute bilateral cerebellar infarction worrisome for metastatic disease or bilateral cerebellar infarction noted  5. Edema of both lower extremities -Encouraged to keep legs elevated whenever she is seated -Advised to wear knee-high compression stockings on in the morning and off at bedtime -Check weight daily and notify provider for any abrupt weight gain greater than 3 pounds in a day - Compression stockings  Family/ staff Communication: Reviewed plan of care with patient and daughter verbalized understanding  Labs/tests ordered:  - CBC with Differential/Platelet - BMP with eGFR([facility])  Next Appointment: As needed if symptoms worsen or fail to improve     Sandrea Hughs, NP

## 2021-12-09 LAB — BASIC METABOLIC PANEL WITH GFR
BUN/Creatinine Ratio: 14 (calc) (ref 6–22)
BUN: 28 mg/dL — ABNORMAL HIGH (ref 7–25)
CO2: 21 mmol/L (ref 20–32)
Calcium: 8.5 mg/dL — ABNORMAL LOW (ref 8.6–10.4)
Chloride: 106 mmol/L (ref 98–110)
Creat: 2.02 mg/dL — ABNORMAL HIGH (ref 0.60–0.95)
Glucose, Bld: 187 mg/dL — ABNORMAL HIGH (ref 65–99)
Potassium: 5.5 mmol/L — ABNORMAL HIGH (ref 3.5–5.3)
Sodium: 137 mmol/L (ref 135–146)
eGFR: 23 mL/min/{1.73_m2} — ABNORMAL LOW (ref >=60)

## 2021-12-09 LAB — CBC WITH DIFFERENTIAL/PLATELET
Absolute Monocytes: 501 cells/uL (ref 200–950)
Basophils Absolute: 31 cells/uL (ref 0–200)
Basophils Relative: 0.4 %
Eosinophils Absolute: 77 cells/uL (ref 15–500)
Eosinophils Relative: 1 %
HCT: 26.9 % — ABNORMAL LOW (ref 35.0–45.0)
Hemoglobin: 8.8 g/dL — ABNORMAL LOW (ref 11.7–15.5)
Lymphs Abs: 1201 cells/uL (ref 850–3900)
MCH: 34.6 pg — ABNORMAL HIGH (ref 27.0–33.0)
MCHC: 32.7 g/dL (ref 32.0–36.0)
MCV: 105.9 fL — ABNORMAL HIGH (ref 80.0–100.0)
MPV: 9.9 fL (ref 7.5–12.5)
Monocytes Relative: 6.5 %
Neutro Abs: 5891 cells/uL (ref 1500–7800)
Neutrophils Relative %: 76.5 %
Platelets: 194 10*3/uL (ref 140–400)
RBC: 2.54 10*6/uL — ABNORMAL LOW (ref 3.80–5.10)
RDW: 15.5 % — ABNORMAL HIGH (ref 11.0–15.0)
Total Lymphocyte: 15.6 %
WBC: 7.7 10*3/uL (ref 3.8–10.8)

## 2021-12-10 ENCOUNTER — Other Ambulatory Visit: Payer: Self-pay

## 2021-12-10 DIAGNOSIS — N289 Disorder of kidney and ureter, unspecified: Secondary | ICD-10-CM

## 2021-12-10 DIAGNOSIS — E876 Hypokalemia: Secondary | ICD-10-CM

## 2021-12-10 DIAGNOSIS — D649 Anemia, unspecified: Secondary | ICD-10-CM

## 2021-12-10 LAB — MYASTHENIA GRAVIS PANEL 1
A CHR BINDING ABS: <0.3 nmol/L
STRIATED MUSCLE AB SCREEN: NEGATIVE

## 2021-12-10 NOTE — Addendum Note (Signed)
Addended by: Logan Bores on: 12/10/2021 01:55 PM   Modules accepted: Orders

## 2021-12-11 ENCOUNTER — Encounter (HOSPITAL_BASED_OUTPATIENT_CLINIC_OR_DEPARTMENT_OTHER): Payer: Self-pay

## 2021-12-11 ENCOUNTER — Emergency Department (HOSPITAL_BASED_OUTPATIENT_CLINIC_OR_DEPARTMENT_OTHER)
Admission: EM | Admit: 2021-12-11 | Discharge: 2021-12-11 | Disposition: A | Discharge status: Home / Self Care | Payer: Medicare Other | Attending: Emergency Medicine | Admitting: Emergency Medicine

## 2021-12-11 ENCOUNTER — Other Ambulatory Visit: Payer: Self-pay

## 2021-12-11 ENCOUNTER — Emergency Department (HOSPITAL_BASED_OUTPATIENT_CLINIC_OR_DEPARTMENT_OTHER): Payer: Medicare Other

## 2021-12-11 DIAGNOSIS — C50919 Malignant neoplasm of unspecified site of unspecified female breast: Secondary | ICD-10-CM | POA: Diagnosis not present

## 2021-12-11 DIAGNOSIS — R531 Weakness: Secondary | ICD-10-CM | POA: Diagnosis present

## 2021-12-11 DIAGNOSIS — E860 Dehydration: Secondary | ICD-10-CM | POA: Insufficient documentation

## 2021-12-11 DIAGNOSIS — R748 Abnormal levels of other serum enzymes: Secondary | ICD-10-CM | POA: Diagnosis not present

## 2021-12-11 DIAGNOSIS — N390 Urinary tract infection, site not specified: Secondary | ICD-10-CM | POA: Diagnosis not present

## 2021-12-11 DIAGNOSIS — C719 Malignant neoplasm of brain, unspecified: Secondary | ICD-10-CM | POA: Insufficient documentation

## 2021-12-11 DIAGNOSIS — R7989 Other specified abnormal findings of blood chemistry: Secondary | ICD-10-CM

## 2021-12-11 DIAGNOSIS — Z7901 Long term (current) use of anticoagulants: Secondary | ICD-10-CM | POA: Insufficient documentation

## 2021-12-11 LAB — COMPREHENSIVE METABOLIC PANEL
ALT: 80 U/L — ABNORMAL HIGH (ref 0–44)
AST: 295 U/L — ABNORMAL HIGH (ref 15–41)
Albumin: 2.9 g/dL — ABNORMAL LOW (ref 3.5–5.0)
Alkaline Phosphatase: 361 U/L — ABNORMAL HIGH (ref 38–126)
Anion gap: 13 (ref 5–15)
BUN: 38 mg/dL — ABNORMAL HIGH (ref 8–23)
CO2: 17 mmol/L — ABNORMAL LOW (ref 22–32)
Calcium: 8.3 mg/dL — ABNORMAL LOW (ref 8.9–10.3)
Chloride: 102 mmol/L (ref 98–111)
Creatinine, Ser: 2.33 mg/dL — ABNORMAL HIGH (ref 0.44–1.00)
GFR, Estimated: 20 mL/min — ABNORMAL LOW (ref >60)
Glucose, Bld: 151 mg/dL — ABNORMAL HIGH (ref 70–99)
Potassium: 5.2 mmol/L — ABNORMAL HIGH (ref 3.5–5.1)
Sodium: 132 mmol/L — ABNORMAL LOW (ref 135–145)
Total Bilirubin: 4.9 mg/dL — ABNORMAL HIGH (ref 0.3–1.2)
Total Protein: 7.9 g/dL (ref 6.5–8.1)

## 2021-12-11 LAB — CBC WITH DIFFERENTIAL/PLATELET
Abs Immature Granulocytes: 0.16 10*3/uL — ABNORMAL HIGH (ref 0.00–0.07)
Basophils Absolute: 0 10*3/uL (ref 0.0–0.1)
Basophils Relative: 1 %
Eosinophils Absolute: 0 10*3/uL (ref 0.0–0.5)
Eosinophils Relative: 0 %
HCT: 29.4 % — ABNORMAL LOW (ref 36.0–46.0)
Hemoglobin: 9.6 g/dL — ABNORMAL LOW (ref 12.0–15.0)
Immature Granulocytes: 2 %
Lymphocytes Relative: 10 %
Lymphs Abs: 0.8 10*3/uL (ref 0.7–4.0)
MCH: 34.4 pg — ABNORMAL HIGH (ref 26.0–34.0)
MCHC: 32.7 g/dL (ref 30.0–36.0)
MCV: 105.4 fL — ABNORMAL HIGH (ref 80.0–100.0)
Monocytes Absolute: 0.6 10*3/uL (ref 0.1–1.0)
Monocytes Relative: 7 %
Neutro Abs: 6.6 10*3/uL (ref 1.7–7.7)
Neutrophils Relative %: 80 %
Platelets: 194 10*3/uL (ref 150–400)
RBC: 2.79 MIL/uL — ABNORMAL LOW (ref 3.87–5.11)
RDW: 20.3 % — ABNORMAL HIGH (ref 11.5–15.5)
Smear Review: NORMAL
WBC: 8.3 10*3/uL (ref 4.0–10.5)
nRBC: 6.3 % — ABNORMAL HIGH (ref 0.0–0.2)

## 2021-12-11 LAB — URINALYSIS, ROUTINE W REFLEX MICROSCOPIC
Bilirubin Urine: NEGATIVE
Glucose, UA: >=500 mg/dL — AB
Ketones, ur: NEGATIVE mg/dL
Nitrite: NEGATIVE
Protein, ur: NEGATIVE mg/dL
Specific Gravity, Urine: 1.015 (ref 1.005–1.030)
pH: 5.5 (ref 5.0–8.0)

## 2021-12-11 LAB — URINALYSIS, MICROSCOPIC (REFLEX)

## 2021-12-11 LAB — LIPASE, BLOOD: Lipase: 86 U/L — ABNORMAL HIGH (ref 11–51)

## 2021-12-11 MED ORDER — SODIUM CHLORIDE 0.9 % IV BOLUS
1000.0000 mL | Freq: Once | INTRAVENOUS | Status: AC
  Administered 2021-12-11: 1000 mL via INTRAVENOUS

## 2021-12-11 MED ORDER — CEPHALEXIN 500 MG PO CAPS: 500.0000 mg | ORAL_CAPSULE | Freq: Two times a day (BID) | ORAL | 0 refills | Status: AC

## 2021-12-11 NOTE — ED Notes (Signed)
Discharge instructions and recommendations reviewed with family. Aware of ABT to pick up and begin. States understanding. Discharged home with family

## 2021-12-11 NOTE — ED Provider Notes (Signed)
[facility] EMERGENCY DEPARTMENT Provider Note   CSN: [id number] Arrival date & time: 12/11/21  1451     History  Chief Complaint  Patient presents with   Hypotension   Dehydration   Nausea    Kristina Jackson is a 88 y.o. female.  She has a history of breast cancer with mets to brain.  She is getting chemotherapy through [facility].  She had a recent admission for same.  Found to have due to CVA, mets to brain, acute renal failure, elevated liver enzymes. family states since she has been home she has been more fatigued and not eating or drinking much.  Sometimes has headaches and sometimes vomiting.  They feel the chemotherapy is too strong for her and have not given her a dose since July 2.  Saw her PCP and found that her kidneys are starting to fail.  They are here to make sure she has not gotten any worse.  They are trying to encourage eating and drinking.  They have noticed her blood pressure to be very labile.  The history is provided by the patient and a relative.  Weakness Severity:  Severe Onset quality:  Gradual Duration:  3 weeks Timing:  Constant Progression:  Unchanged Chronicity:  New Context: change in medication   Relieved by:  Nothing Worsened by:  Activity Ineffective treatments:  Lying down and drinking fluids Associated symptoms: headaches, lethargy, nausea and vomiting   Associated symptoms: no abdominal pain, no chest pain, no cough, no diarrhea, no dysuria, no fever, no foul-smelling urine and no shortness of breath   Risk factors: neurologic disease and new medications        Home Medications Prior to Admission medications   Medication Sig Start Date End Date Taking? Authorizing Provider  atorvastatin (LIPITOR) 20 MG tablet Take 20 mg by mouth daily.    [provider]  b complex vitamins capsule Take 1 capsule by mouth daily.    [provider]  Chlorophyll (CHLOROXYGEN PO) Take 1 capsule by mouth 2 (two) times daily.    [provider]  Cholecalciferol (VITAMIN D3) 125 MCG (5000 UT) CAPS Take by mouth daily.    [provider]  Cyanocobalamin (B-12) 1000 MCG CAPS Take by mouth daily.    [provider]  elacestrant hydrochloride (ORSERDU) 345 MG tablet Take 345 mg by mouth daily. Take with food.    [provider]  Emollient (COLLAGEN EX) Apply topically 3 (three) times daily.    [provider]  empagliflozin (JARDIANCE) 10 MG TABS tablet Take by mouth daily.    [provider]  gabapentin (NEURONTIN) 100 MG capsule Take 1 capsule (100 mg total) by mouth 2 (two) times daily. 11/25/21   Ngetich, Dinah C, NP  isosorbide dinitrate (ISORDIL) 30 MG tablet Take 30 mg by mouth daily.    [provider]  metFORMIN (GLUCOPHAGE) 500 MG tablet Take 1 tablet (500 mg total) by mouth 2 (two) times daily. Patient taking differently: Take 500 mg by mouth daily. 11/18/21   Ngetich, Dinah C, NP  mirtazapine (REMERON) 15 MG tablet Take 15 mg by mouth daily.    [provider]  Multiple Vitamins-Minerals (MULTIVITAMIN GUMMIES WOMENS PO) Take by mouth daily.    [provider]  Omega-3 Fatty Acids (OMEGA-3 FISH OIL PO) Take 2,000 mg by mouth 2 (two) times daily.    [provider]  OVER THE COUNTER MEDICATION Take 1 capsule by mouth 2 (two) times daily. Sucontral  D ( Blood Sugar Balance )    [provider]  Riboflavin (VITAMIN B-2 PO) Take 250 mg by mouth daily.    [provider]  Rivaroxaban (XARELTO) 15 MG TABS tablet Take 15 mg by mouth in the morning.    [provider]  traZODone (DESYREL) 100 MG tablet Take 100 mg by mouth at bedtime. 11/25/21   Ngetich, Dinah C, NP  Zinc 20 MG CAPS Take by mouth daily.    [provider]      Allergies    Patient has no known allergies.    Review of Systems   Review of Systems  Constitutional:  Negative for fever.  HENT:  Negative for sore throat.   Eyes:  Negative for  visual disturbance.  Respiratory:  Negative for cough and shortness of breath.   Cardiovascular:  Negative for chest pain.  Gastrointestinal:  Positive for nausea and vomiting. Negative for abdominal pain and diarrhea.  Genitourinary:  Negative for dysuria.  Skin:  Negative for rash.  Neurological:  Positive for weakness and headaches.    Physical Exam Updated Vital Signs BP 119/68   Pulse 72   Temp 97.7 F (36.5 C) (Oral)   Resp 17   Ht '4\' 9"'  (1.448 m)   Wt 56.2 kg   SpO2 95%   BMI 26.83 kg/m  Physical Exam Vitals and nursing note reviewed.  Constitutional:      General: She is not in acute distress.    Appearance: Normal appearance. She is well-developed.  HENT:     Head: Normocephalic and atraumatic.  Eyes:     Conjunctiva/sclera: Conjunctivae normal.  Cardiovascular:     Rate and Rhythm: Normal rate and regular rhythm.     Heart sounds: No murmur heard. Pulmonary:     Effort: Pulmonary effort is normal. No respiratory distress.     Breath sounds: Normal breath sounds.  Abdominal:     Palpations: Abdomen is soft.     Tenderness: There is no abdominal tenderness. There is no guarding or rebound.  Musculoskeletal:        General: Normal range of motion.     Cervical back: Neck supple.     Right lower leg: No edema.     Left lower leg: No edema.  Skin:    General: Skin is warm and dry.     Capillary Refill: Capillary refill takes less than 2 seconds.  Neurological:     General: No focal deficit present.     Mental Status: She is alert.     Cranial Nerves: No cranial nerve deficit.     Sensory: No sensory deficit.     Motor: No weakness.     ED Results / Procedures / Treatments   Labs (all labs ordered are listed, but only abnormal results are displayed) Labs Reviewed  COMPREHENSIVE METABOLIC PANEL - Abnormal; Notable for the following components:      Result Value   Sodium 132 (*)    Potassium 5.2 (*)    CO2 17 (*)    Glucose, Bld 151 (*)    BUN 38  (*)    Creatinine, Ser 2.33 (*)    Calcium 8.3 (*)    Albumin 2.9 (*)    AST 295 (*)    ALT 80 (*)    Alkaline Phosphatase 361 (*)    Total Bilirubin 4.9 (*)    GFR, Estimated 20 (*)    All other components within normal limits  CBC WITH  DIFFERENTIAL/PLATELET - Abnormal; Notable for the following components:   RBC 2.79 (*)    Hemoglobin 9.6 (*)    HCT 29.4 (*)    MCV 105.4 (*)    MCH 34.4 (*)    RDW 20.3 (*)    nRBC 6.3 (*)    Abs Immature Granulocytes 0.16 (*)    All other components within normal limits  LIPASE, BLOOD - Abnormal; Notable for the following components:   Lipase 86 (*)    All other components within normal limits  URINALYSIS, ROUTINE W REFLEX MICROSCOPIC - Abnormal; Notable for the following components:   APPearance CLOUDY (*)    Glucose, UA >=500 (*)    Hgb urine dipstick TRACE (*)    Leukocytes,Ua MODERATE (*)    All other components within normal limits  URINALYSIS, MICROSCOPIC (REFLEX) - Abnormal; Notable for the following components:   Bacteria, UA MANY (*)    All other components within normal limits  URINE CULTURE    EKG EKG Interpretation  Date/Time:  Thursday December 11 2021 15:04:59 EDT Ventricular Rate:  70 PR Interval:  159 QRS Duration: 154 QT Interval:  462 QTC Calculation: 499 R Axis:   195 Text Interpretation: Sinus rhythm Right bundle branch block No significant change since prior 2/22 Confirmed by Aletta Edouard [phone]) on 12/11/2021 3:28:27 PM  Radiology DG Chest Port 1 View  Result Date: 12/11/2021 CLINICAL DATA:  cough/choke. Low blood pressure, fatigue and possible dehydration. EXAM: PORTABLE CHEST 1 VIEW COMPARISON:  July 09, 2020 FINDINGS: Mild cardiomegaly. No focal consolidation, pleural effusion or significant vascular congestion. Moderately severe thoracic spondylosis. IMPRESSION: Cardiomegaly.  Lungs appear clear. Electronically Signed   By: Frazier Richards M.D.   On: 12/11/2021 17:03    Procedures Procedures     Medications Ordered in ED Medications  sodium chloride 0.9 % bolus 1,000 mL (0 mLs Intravenous Stopped 12/11/21 1723)    ED Course/ Medical Decision Making/ A&P Clinical Course as of 12/12/21 0854  Thu Dec 11, 2021  1546 Discharge summary from [facility] 6/26 - Discharge Diagnoses:  Principal Problem: Cerebrovascular accident (CVA) involving cerebellum (CMS-HCC) Active Problems: Anemia of chronic disease Breast cancer metastasized to brain (CMS-HCC) Acute renal failure superimposed on stage 3 chronic kidney disease (CMS-HCC) Elevated liver enzymes 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 * Primary Diagnosis: Admitted for  Bilateral acute versus subacute cerebellar ischemic stroke in the setting of metastatic breast cancer. -Concern for cerebellar metastasis, outpatient follow-up recommended  -Elevated LFTs in the setting of metastasis plus dehydration  -AKI on CKD stage III  Changes Made:  - Discontinued glipizide at discharge - Decreased metformin from 500 mg twice daily to once daily until follow-up with PCP with repeat BMP. -Recommend to DC lisinopril due to PSA patient has not been taking it anyways. -Decreased the dose of gabapentin from 200 mg to 100 mg nightly.  Anticipatory Guidance for Outpatient Provider:  Admitted with subacute bilateral cerebellar r>L ischemic stroke and also concern for cerebellar metastasis. Was seen by neurology team, recommend continuation of anticoagulation. Recommend switching to either Lovenox versus Eliquis for anticoagulation in the setting of underlying malignancy.  Discussed with the primary oncologist as well as inpatient oncology team, patient has follow-up appointments with her oncology team to be considered for brain radiation.  Recommended Follow-up Studies: CBC, BMP in 1 week's time  [MB]  1610 Reviewed prior labs  at discharge from [facility].  Her LFTs  were elevated but not as bad as today.  T. bili was 1.9 alk phos of 281 AST of 148 ALT of 50.  Creatinine was 1.7. [MB]  6244 Discussed with Dr. Annabell Sabal her primary oncologist at [facility].  She reviewed the patient's discharge summary.  She did not feel the patient needed to be admitted at this time as her only intervention would be to hydrate her.  She said the patient can go home and the clinic will call the patient and family tomorrow to discuss management. [MB]  6950 Reviewed recommendations for Dr. Annabell Sabal with patient and family.  They are comfortable plan for discharge and for the office to call them tomorrow regarding further plan. [MB]    Clinical Course User Index [MB] Hayden Rasmussen, MD                           Medical Decision Making Amount and/or Complexity of Data Reviewed Labs: ordered. Radiology: ordered.  Risk Prescription drug management.   This patient complains of generalized weakness poor p.o. intake; this involves an extensive number of treatment Options and is a complaint that carries with it a high risk of complications and morbidity. The differential includes failure to thrive, dehydration, infection, metabolic derangement renal failure  I ordered, reviewed and interpreted labs, which included CBC with normal white count, hemoglobin low stable from priors chemistries with worsening potassium and renal function worsening LFTs urinalysis possible signs of infection sent for culture I ordered medication IV fluids with improvement in her symptoms and reviewed PMP when indicated. I ordered imaging studies which included chest x-ray and I independently    visualized and interpreted imaging which showed no acute infiltrates Additional history obtained from patient's family members Previous records obtained and reviewed in epic including recent discharge summary from [facility] I consulted Dr. Annabell Sabal oncology [facility] and discussed lab and imaging findings  and discussed disposition.  Cardiac monitoring reviewed, normal sinus rhythm Social determinants considered, none Critical Interventions: None  After the interventions stated above, I reevaluated the patient and found patient to be improved after fluids Admission and further testing considered, she would like to go home, I think this is reasonable.  Had extensive conversation with patient's primary oncologist and she is hoping to have end-of-life discussion with family tomorrow.  She is recommending patient be returned home unless emergent medical indication for admission.  Family comfortable with the plan for return home.  Return instructions discussed         Final Clinical Impression(s) / ED Diagnoses Final diagnoses:  Dehydration  Primary malignant neoplasm of breast with metastasis to other site, unspecified laterality ([place])  Elevated liver function tests  Lower urinary tract infection, acute    Rx / DC Orders ED Discharge Orders          Ordered    cephALEXin (KEFLEX) 500 MG capsule  2 times daily        12/11/21 1736              Hayden Rasmussen, MD 12/12/21 [phone]

## 2021-12-11 NOTE — Discharge Instructions (Signed)
You were seen in the emergency department for evaluation of generalized weakness poor oral intake.  Your lab work showed you to be dehydrated.  Your kidney function is worse along with your liver function.  Dr. Annabell Sabal oncology is recommending home with close follow-up and her office will call you tomorrow for further instructions.  Your urine showed possible signs of infection so we are starting you on an antibiotic.  Return to the emergency department if any worsening or concerning symptoms

## 2021-12-11 NOTE — ED Triage Notes (Signed)
Pt BIB family for low blood pressures, fatigue, and possible dehydration. Family member states that pt's BP has been low for "a while" and that she hasn't been wanting to eat, drink, or take her meds today.

## 2021-12-14 LAB — URINE CULTURE: Culture: >=100000 — AB

## 2021-12-15 ENCOUNTER — Telehealth (HOSPITAL_BASED_OUTPATIENT_CLINIC_OR_DEPARTMENT_OTHER): Payer: Self-pay | Admitting: *Deleted

## 2021-12-15 NOTE — Telephone Encounter (Signed)
Post ED Visit - Positive Culture Follow-up  Culture report reviewed by antimicrobial stewardship pharmacist: [place] Team '[]'$  Elenor Quinones, Pharm.D. '[]'$  Heide Guile, Pharm.D., BCPS AQ-ID '[]'$  Parks Neptune, Pharm.D., BCPS '[]'$  Alycia Rossetti, Pharm.D., BCPS '[]'$  [place], Pharm.D., BCPS, AAHIVP '[]'$  Legrand Como, Pharm.D., BCPS, AAHIVP '[]'$  Salome Arnt, PharmD, BCPS '[]'$  Johnnette Gourd, PharmD, BCPS '[]'$  Hughes Better, PharmD, BCPS '[]'$  Leeroy Cha, PharmD '[]'$  Laqueta Linden, PharmD, BCPS '[x]'$  Arturo Morton, PharmD  [place] Team '[]'$  Leodis Sias, PharmD '[]'$  Lindell Spar, PharmD '[]'$  Royetta Asal, PharmD '[]'$  Graylin Shiver, Rph '[]'$  Rema Fendt) Glennon Mac, PharmD '[]'$  Arlyn Dunning, PharmD '[]'$  Netta Cedars, PharmD '[]'$  Dia Sitter, PharmD '[]'$  Leone Haven, PharmD '[]'$  Gretta Arab, PharmD '[]'$  Theodis Shove, PharmD '[]'$  Peggyann Juba, PharmD '[]'$  Reuel Boom, PharmD   Positive urine culture Treated with Cephalexin, organism sensitive to the same and no further patient follow-up is required at this time.  Kristina Jackson 12/15/2021, 12:25 PM

## 2021-12-17 ENCOUNTER — Other Ambulatory Visit: Payer: Medicare Other

## 2021-12-18 ENCOUNTER — Telehealth: Payer: Self-pay | Admitting: *Deleted

## 2021-12-18 NOTE — Telephone Encounter (Signed)
So sorry to hear.My thoughts and prayers are with you and your entire family.

## 2021-12-18 NOTE — Telephone Encounter (Signed)
Kristina Jackson called and just wanted to let you know that patient has been transferred over to [facility] at [facility].    FYI

## 2021-12-26 NOTE — Telephone Encounter (Signed)
[facility] called and states that patient had referral sent in on 12/15/2021. She states that Dr.Ryan Joelyn Oms looked over patient information. States that referral is no longer needed due to patient being in Hospice. Message routed to PCP Ngetich, Nelda Bucks, NP as Juluis Rainier. No further action required.

## 2021-12-26 NOTE — Telephone Encounter (Signed)
Noted  

## 2022-01-06 DEATH — deceased

## 2022-04-28 ENCOUNTER — Ambulatory Visit: Payer: Medicare Other | Admitting: Family
